# Patient Record
Sex: Female | Born: 1950 | Race: Black or African American | Hispanic: No | Smoking: Current every day smoker
Health system: Southern US, Community
[De-identification: ages and names within clinical notes are randomized; demographics above are authoritative.]

## PROBLEM LIST (undated history)

## (undated) DIAGNOSIS — I10 Essential (primary) hypertension: Secondary | ICD-10-CM

## (undated) DIAGNOSIS — E7800 Pure hypercholesterolemia, unspecified: Secondary | ICD-10-CM

## (undated) DIAGNOSIS — E876 Hypokalemia: Secondary | ICD-10-CM

## (undated) HISTORY — PX: PARTIAL HYSTERECTOMY: SHX80

---

## 2008-03-27 ENCOUNTER — Emergency Department (HOSPITAL_BASED_OUTPATIENT_CLINIC_OR_DEPARTMENT_OTHER): Admission: EM | Admit: 2008-03-27 | Discharge: 2008-03-27 | Payer: Self-pay | Admitting: Emergency Medicine

## 2011-06-05 ENCOUNTER — Emergency Department (HOSPITAL_BASED_OUTPATIENT_CLINIC_OR_DEPARTMENT_OTHER)
Admission: EM | Admit: 2011-06-05 | Discharge: 2011-06-05 | Disposition: A | Discharge status: Home / Self Care | Payer: Self-pay | Attending: Emergency Medicine | Admitting: Emergency Medicine

## 2011-06-05 ENCOUNTER — Encounter (HOSPITAL_BASED_OUTPATIENT_CLINIC_OR_DEPARTMENT_OTHER): Payer: Self-pay | Admitting: Emergency Medicine

## 2011-06-05 DIAGNOSIS — I10 Essential (primary) hypertension: Secondary | ICD-10-CM | POA: Insufficient documentation

## 2011-06-05 DIAGNOSIS — F17200 Nicotine dependence, unspecified, uncomplicated: Secondary | ICD-10-CM | POA: Insufficient documentation

## 2011-06-05 DIAGNOSIS — Z79899 Other long term (current) drug therapy: Secondary | ICD-10-CM | POA: Insufficient documentation

## 2011-06-05 DIAGNOSIS — M5380 Other specified dorsopathies, site unspecified: Secondary | ICD-10-CM | POA: Insufficient documentation

## 2011-06-05 DIAGNOSIS — M62830 Muscle spasm of back: Secondary | ICD-10-CM

## 2011-06-05 HISTORY — DX: Essential (primary) hypertension: I10

## 2011-06-05 MED ORDER — HYDROCODONE-ACETAMINOPHEN 5-325 MG PO TABS: 1.0000 | ORAL_TABLET | Freq: Four times a day (QID) | ORAL | Status: AC | PRN

## 2011-06-05 MED ORDER — IBUPROFEN 600 MG PO TABS: 600.0000 mg | ORAL_TABLET | Freq: Four times a day (QID) | ORAL | Status: AC | PRN

## 2011-06-05 MED ORDER — KETOROLAC TROMETHAMINE 60 MG/2ML IM SOLN
60.0000 mg | Freq: Once | INTRAMUSCULAR | Status: AC
  Administered 2011-06-05: 60 mg via INTRAMUSCULAR
  Filled 2011-06-05: qty 2

## 2011-06-05 MED ORDER — MORPHINE SULFATE 4 MG/ML IJ SOLN
4.0000 mg | Freq: Once | INTRAMUSCULAR | Status: AC
  Administered 2011-06-05: 4 mg via INTRAMUSCULAR
  Filled 2011-06-05: qty 1

## 2011-06-05 NOTE — ED Notes (Signed)
Back pain that started Saturday.  Denies any injury.  States has been taking muscle relaxer's and Tramadol without relief.

## 2011-06-05 NOTE — ED Provider Notes (Signed)
History     CSN: [id number]  Arrival date & time 06/05/11  1016   First MD Initiated Contact with Patient 06/05/11 1023      Chief Complaint  Patient presents with  . Back Pain    (Consider location/radiation/quality/duration/timing/severity/associated sxs/prior treatment) HPI Pt began having l sided lumbar pain 3 days ago that is characterized as spasms. No weakness, numbness, incontinence, fever, previous trauma. Pain is reproduced with movement and palpation. Has been taking ultram and muscle relaxants without symptom relief.  Past Medical History  Diagnosis Date  . Hypertension     History reviewed. No pertinent past surgical history.  No family history on file.  History  Substance Use Topics  . Smoking status: Current Some Day Smoker  . Smokeless tobacco: Not on file  . Alcohol Use: No    OB History    Grav Para Term Preterm Abortions TAB SAB Ect Mult Living                  Review of Systems  Constitutional: Negative for fever and chills.  Genitourinary: Negative for dysuria, flank pain and difficulty urinating.  Musculoskeletal: Positive for back pain. Negative for gait problem.  Skin: Negative for rash and wound.  Neurological: Negative for weakness and numbness.    Allergies  Flexeril; Lisinopril; Metoprolol; and Tramadol  Home Medications   Current Outpatient Rx  Name Route Sig Dispense Refill  . HYDROCODONE-ACETAMINOPHEN 5-325 MG PO TABS Oral Take 1 tablet by mouth every 6 (six) hours as needed for pain. 15 tablet 0  . IBUPROFEN 600 MG PO TABS Oral Take 1 tablet (600 mg total) by mouth every 6 (six) hours as needed for pain. 30 tablet 0    BP 136/96  Pulse 93  Temp(Src) 98.5 F (36.9 C) (Oral)  Resp 16  Ht 5\' 5"  (1.651 m)  Wt 150 lb (68.04 kg)  BMI 24.96 kg/m2  SpO2 100%  Physical Exam  Nursing note and vitals reviewed. Constitutional: She is oriented to person, place, and time. She appears well-developed and well-nourished. No  distress.  HENT:  Head: Normocephalic and atraumatic.  Neck: Normal range of motion. Neck supple.  Cardiovascular: Normal rate.   Pulmonary/Chest: Effort normal.  Abdominal: Soft. Bowel sounds are normal. There is no tenderness. There is no rebound and no guarding.  Musculoskeletal: Normal range of motion. She exhibits tenderness (Tenderness and muscle spasm noted in L paraspinal lumbar region). She exhibits no edema.  Neurological: She is alert and oriented to person, place, and time.       5/5 motor, ambulatory without assistance, no sensory deficits  Skin: Skin is warm and dry. No rash noted. No erythema.    ED Course  Procedures (including critical care time)  Labs Reviewed - No data to display No results found.   1. Lumbar paraspinal muscle spasm       MDM          Loren Racer, MD 06/05/11 1112

## 2011-06-05 NOTE — Discharge Instructions (Signed)

## 2013-08-09 ENCOUNTER — Emergency Department (HOSPITAL_BASED_OUTPATIENT_CLINIC_OR_DEPARTMENT_OTHER)
Admission: EM | Admit: 2013-08-09 | Discharge: 2013-08-10 | Disposition: A | Discharge status: Home / Self Care | Payer: Self-pay | Attending: Emergency Medicine | Admitting: Emergency Medicine

## 2013-08-09 ENCOUNTER — Encounter (HOSPITAL_BASED_OUTPATIENT_CLINIC_OR_DEPARTMENT_OTHER): Payer: Self-pay | Admitting: Emergency Medicine

## 2013-08-09 ENCOUNTER — Emergency Department (HOSPITAL_BASED_OUTPATIENT_CLINIC_OR_DEPARTMENT_OTHER): Payer: Self-pay

## 2013-08-09 DIAGNOSIS — R7989 Other specified abnormal findings of blood chemistry: Secondary | ICD-10-CM

## 2013-08-09 DIAGNOSIS — R748 Abnormal levels of other serum enzymes: Secondary | ICD-10-CM | POA: Insufficient documentation

## 2013-08-09 DIAGNOSIS — I10 Essential (primary) hypertension: Secondary | ICD-10-CM | POA: Insufficient documentation

## 2013-08-09 DIAGNOSIS — R55 Syncope and collapse: Secondary | ICD-10-CM | POA: Insufficient documentation

## 2013-08-09 DIAGNOSIS — E876 Hypokalemia: Secondary | ICD-10-CM | POA: Insufficient documentation

## 2013-08-09 DIAGNOSIS — F17200 Nicotine dependence, unspecified, uncomplicated: Secondary | ICD-10-CM | POA: Insufficient documentation

## 2013-08-09 DIAGNOSIS — R9431 Abnormal electrocardiogram [ECG] [EKG]: Secondary | ICD-10-CM | POA: Insufficient documentation

## 2013-08-09 LAB — CBC WITH DIFFERENTIAL/PLATELET
BASOS ABS: 0 10*3/uL (ref 0.0–0.1)
BASOS PCT: 0 % (ref 0–1)
Eosinophils Absolute: 0.1 10*3/uL (ref 0.0–0.7)
Eosinophils Relative: 2 % (ref 0–5)
HEMATOCRIT: 38.2 % (ref 36.0–46.0)
HEMOGLOBIN: 13 g/dL (ref 12.0–15.0)
LYMPHS PCT: 58 % — AB (ref 12–46)
Lymphs Abs: 2.7 10*3/uL (ref 0.7–4.0)
MCH: 31.1 pg (ref 26.0–34.0)
MCHC: 34 g/dL (ref 30.0–36.0)
MCV: 91.4 fL (ref 78.0–100.0)
MONO ABS: 0.3 10*3/uL (ref 0.1–1.0)
MONOS PCT: 7 % (ref 3–12)
NEUTROS ABS: 1.5 10*3/uL — AB (ref 1.7–7.7)
NEUTROS PCT: 33 % — AB (ref 43–77)
Platelets: 197 10*3/uL (ref 150–400)
RBC: 4.18 MIL/uL (ref 3.87–5.11)
RDW: 13.6 % (ref 11.5–15.5)
WBC: 4.7 10*3/uL (ref 4.0–10.5)

## 2013-08-09 LAB — URINALYSIS, ROUTINE W REFLEX MICROSCOPIC
Bilirubin Urine: NEGATIVE
GLUCOSE, UA: 100 mg/dL — AB
Hgb urine dipstick: NEGATIVE
Ketones, ur: NEGATIVE mg/dL
NITRITE: NEGATIVE
PH: 7.5 (ref 5.0–8.0)
Protein, ur: NEGATIVE mg/dL
SPECIFIC GRAVITY, URINE: 1.018 (ref 1.005–1.030)
Urobilinogen, UA: 1 mg/dL (ref 0.0–1.0)

## 2013-08-09 LAB — BASIC METABOLIC PANEL
ANION GAP: 11 (ref 5–15)
BUN: 26 mg/dL — ABNORMAL HIGH (ref 6–23)
CHLORIDE: 103 meq/L (ref 96–112)
CO2: 31 meq/L (ref 19–32)
CREATININE: 1.2 mg/dL — AB (ref 0.50–1.10)
Calcium: 10.1 mg/dL (ref 8.4–10.5)
GFR calc non Af Amer: 47 mL/min — ABNORMAL LOW (ref >90)
GFR, EST AFRICAN AMERICAN: 54 mL/min — AB (ref >90)
Glucose, Bld: 97 mg/dL (ref 70–99)
POTASSIUM: 2.6 meq/L — AB (ref 3.7–5.3)
Sodium: 145 mEq/L (ref 137–147)

## 2013-08-09 LAB — URINE MICROSCOPIC-ADD ON

## 2013-08-09 LAB — HEPATIC FUNCTION PANEL
ALBUMIN: 3.9 g/dL (ref 3.5–5.2)
ALT: 11 U/L (ref 0–35)
AST: 16 U/L (ref 0–37)
Alkaline Phosphatase: 63 U/L (ref 39–117)
Bilirubin, Direct: <0.2 mg/dL (ref 0.0–0.3)
TOTAL PROTEIN: 7.7 g/dL (ref 6.0–8.3)
Total Bilirubin: <0.2 mg/dL — ABNORMAL LOW (ref 0.3–1.2)

## 2013-08-09 LAB — TROPONIN I: Troponin I: <0.3 ng/mL (ref <0.30)

## 2013-08-09 LAB — PRO B NATRIURETIC PEPTIDE: PRO B NATRI PEPTIDE: 132.8 pg/mL — AB (ref 0–125)

## 2013-08-09 MED ORDER — POTASSIUM CHLORIDE CRYS ER 20 MEQ PO TBCR
40.0000 meq | EXTENDED_RELEASE_TABLET | Freq: Once | ORAL | Status: AC
  Administered 2013-08-09: 40 meq via ORAL
  Filled 2013-08-09: qty 2

## 2013-08-09 MED ORDER — ONDANSETRON HCL 4 MG/2ML IJ SOLN
4.0000 mg | Freq: Once | INTRAMUSCULAR | Status: AC
  Administered 2013-08-09: 4 mg via INTRAVENOUS
  Filled 2013-08-09: qty 2

## 2013-08-09 MED ORDER — SODIUM CHLORIDE 0.9 % IV SOLN
INTRAVENOUS | Status: DC
  Administered 2013-08-09: 21:00:00 via INTRAVENOUS

## 2013-08-09 MED ORDER — POTASSIUM CHLORIDE 10 MEQ/100ML IV SOLN
10.0000 meq | Freq: Once | INTRAVENOUS | Status: AC
  Administered 2013-08-09: 10 meq via INTRAVENOUS
  Filled 2013-08-09: qty 100

## 2013-08-09 MED ORDER — LORAZEPAM 2 MG/ML IJ SOLN
0.5000 mg | Freq: Once | INTRAMUSCULAR | Status: AC
  Administered 2013-08-09: 0.5 mg via INTRAVENOUS
  Filled 2013-08-09: qty 1

## 2013-08-09 NOTE — ED Notes (Signed)
Pt states that she has been feeling weak and faint for 3 days and she has been feeling anxious and like she may pass out (seeing stars).  Pt denies any GU symptoms but reports that she has been nauseated and had chills with this, no focal weakness

## 2013-08-09 NOTE — ED Provider Notes (Signed)
CSN: [id number]     Arrival date & time 08/09/13  1912 History   First MD Initiated Contact with Patient 08/09/13 1937     Chief Complaint  Patient presents with  . Dizziness     (Consider location/radiation/quality/duration/timing/severity/associated sxs/prior Treatment) HPI  Valerie Kane is a 63 y.o. female with past medical history significant for hypertension complaining of feeling lightheaded and presyncopal with palpitations, shortness of breath, chills and nausea over the course of 3 days. Patient denies vertigo, chest pain, cough, fever, abdominal pain,  vomiting, change in bowel or bladder habits,  change in by mouth intake, headache, unilateral weakness, dysarthria, ataxia. States that her vision has been a bit blurred. Patient states she had a stress test and possible Holter monitor in January of 2014. Does not follow with a cardiologist. States this does not feel like anxiety attack. Patient states she's been compliant with her hypertension medication : Lisinopril/hydrochlorothiazide.   Past Medical History  Diagnosis Date  . Hypertension    History reviewed. No pertinent past surgical history. No family history on file. History  Substance Use Topics  . Smoking status: Current Some Day Smoker  . Smokeless tobacco: Not on file  . Alcohol Use: No   OB History   Grav Para Term Preterm Abortions TAB SAB Ect Mult Living                 Review of Systems  10 systems reviewed and found to be negative, except as noted in the HPI.   Allergies  Flexeril; Lisinopril; Metoprolol; and Tramadol  Home Medications   Prior to Admission medications   Not on File   BP 158/105  Pulse 75  Temp(Src) 98.8 F (37.1 C) (Oral)  Resp 15  Ht 5\' 5"  (1.651 m)  Wt 110 lb (49.896 kg)  BMI 18.31 kg/m2  SpO2 97% Physical Exam  Nursing note and vitals reviewed. Constitutional: She is oriented to person, place, and time. She appears well-developed and well-nourished. No distress.   HENT:  Head: Normocephalic and atraumatic.  Mouth/Throat: Oropharynx is clear and moist.  Eyes: Conjunctivae and EOM are normal. Pupils are equal, round, and reactive to light.  Neck: Normal range of motion. Neck supple.  Cardiovascular: Normal rate, regular rhythm and intact distal pulses.   Pulmonary/Chest: Effort normal and breath sounds normal. No stridor. No respiratory distress. She has no wheezes. She has no rales. She exhibits no tenderness.  Abdominal: Soft. Bowel sounds are normal. She exhibits no distension and no mass. There is no tenderness. There is no rebound and no guarding.  Musculoskeletal: Normal range of motion. She exhibits no edema.  Neurological: She is alert and oriented to person, place, and time.  II-Visual fields grossly intact. III/IV/VI-Extraocular movements intact.  Pupils reactive bilaterally. V/VII-Smile symmetric, equal eyebrow raise,  facial sensation intact VIII- Hearing grossly intact IX/X-Normal gag XI-bilateral shoulder shrug XII-midline tongue extension Motor: 5/5 bilaterally with normal tone and bulk Cerebellar: Normal finger-to-nose  and normal heel-to-shin test.   Romberg negative Ambulates with a coordinated gait   Psychiatric: She has a normal mood and affect.    ED Course  Procedures (including critical care time) Labs Review Labs Reviewed  URINALYSIS, ROUTINE W REFLEX MICROSCOPIC - Abnormal; Notable for the following:    APPearance CLOUDY (*)    Glucose, UA 100 (*)    Leukocytes, UA SMALL (*)    All other components within normal limits  CBC WITH DIFFERENTIAL - Abnormal; Notable for the following:  Neutrophils Relative % 33 (*)    Neutro Abs 1.5 (*)    Lymphocytes Relative 58 (*)    All other components within normal limits  BASIC METABOLIC PANEL - Abnormal; Notable for the following:    Potassium 2.6 (*)    BUN 26 (*)    Creatinine, Ser 1.20 (*)    GFR calc non Af Amer 47 (*)    GFR calc Af Amer 54 (*)    All other  components within normal limits  HEPATIC FUNCTION PANEL - Abnormal; Notable for the following:    Total Bilirubin <0.2 (*)    All other components within normal limits  PRO B NATRIURETIC PEPTIDE - Abnormal; Notable for the following:    Pro B Natriuretic peptide (BNP) 132.8 (*)    All other components within normal limits  URINE MICROSCOPIC-ADD ON - Abnormal; Notable for the following:    Squamous Epithelial / LPF MANY (*)    Bacteria, UA MANY (*)    All other components within normal limits  URINE CULTURE  TROPONIN I    Imaging Review Dg Chest 2 View  08/09/2013   CLINICAL DATA:  Weakness with anxiety and syncopal sensation for 3 days.  EXAM: CHEST  2 VIEW  COMPARISON:  None.  FINDINGS: The heart size and mediastinal contours are normal. The lungs are clear. There is no pleural effusion or pneumothorax. No acute osseous findings are identified.  IMPRESSION: No active cardiopulmonary process.   Electronically Signed   By: Roxy Horseman M.D.   On: 08/09/2013 20:03     EKG Interpretation   Date/Time:  Sunday August 09 2013 19:37:35 EDT Ventricular Rate:  83 PR Interval:  158 QRS Duration: 148 QT Interval:  428 QTC Calculation: 502 R Axis:   -30 Text Interpretation:  Sinus rhythm with occasional Premature ventricular  complexes Possible Left atrial enlargement Left axis deviation Right  bundle branch block Left ventricular hypertrophy No previous ECGs  available Confirmed by [facility]  MD, Jonny Ruiz ([id number]) on 08/09/2013 9:28:25 PM     Normal sinus rhythm at 83 beats per minute with PVC, right bundle branch block, left-sided axis. Slightly prolonged QTC. No old EKG for comparison.  MDM   Final diagnoses:  Hypokalemia  Abnormal EKG  Pre-syncope  Elevated serum creatinine  Tobacco use disorder    Filed Vitals:   08/09/13 2223 08/09/13 2226 08/09/13 2230 08/09/13 2245  BP: 144/102 158/105    Pulse: 77  29 75  Temp: 98.8 F (37.1 C)     TempSrc: Oral     Resp: 12  19 15   Height:       Weight:      SpO2: 100%  97% 97%    Medications  0.9 %  sodium chloride infusion ( Intravenous New Bag/Given 08/09/13 2129)  potassium chloride 10 mEq in 100 mL IVPB (10 mEq Intravenous New Bag/Given 08/09/13 2337)  potassium chloride 10 mEq in 100 mL IVPB (0 mEq Intravenous Stopped 08/09/13 2230)  potassium chloride SA (K-DUR,KLOR-CON) CR tablet 40 mEq (40 mEq Oral Given 08/09/13 2128)  ondansetron (ZOFRAN) injection 4 mg (4 mg Intravenous Given 08/09/13 2128)  LORazepam (ATIVAN) injection 0.5 mg (0.5 mg Intravenous Given 08/09/13 2221)  potassium chloride SA (K-DUR,KLOR-CON) CR tablet 40 mEq (40 mEq Oral Given 08/09/13 2337)    Valerie Kane is a 63 y.o. female presenting with lightheaded sensation, palpitations, chills, nausea, and presyncopal sensation. EKG with PVC, right bundle branch block. Unknown if these are new features  as we have an old EKG to compare to. Patient's potassium is found to be very low at 2.6. Likely secondary to a blood pressure medication which contains hydrochlorothiazide. Patient will be repleted via IV and by mouth. Creatinine noted to be 1.2, again baseline unknown. Patient will be given gentle hydration and 75 cc per hour. Troponin negative, chest x-ray with no signs of CHF, lung sounds are clear to auscultation, there is no peripheral edema and BNP is under 150. Neuro exam is nonfocal.  Considering patient's PVCs, palpitations, low potassium and elevated renal function observation admission is warranted. Patient requests transfer to [facility]. Discussed case with internal medicine Dr. Gwenevere Abbot, who declined admission and transfer: States that potassium can be repleted and she can check with her primary care physician for recheck in the morning.  This is a shared visit with the attending physician who personally evaluated the patient and agrees with the care plan.   Urinalysis is highly contaminated. Will culture but not treat as patient has no  urinary symptoms.  Case signed out to Dr. Preston Fleeting at shift change: Plan is to recheck potassium in the a.m. and discharge to home corrects. Patient's daughter can come pick her up at that time.       Wynetta Emery, PA-C 08/10/13 [phone]

## 2013-08-09 NOTE — ED Notes (Signed)
Patient gave urine sample, I took to lab. 

## 2013-08-09 NOTE — ED Provider Notes (Signed)
Medical screening examination/treatment/procedure(s) were conducted as a shared visit with non-physician practitioner(s) and myself.  I personally evaluated the patient during the encounter.   EKG Interpretation   Date/Time:  Sunday August 09 2013 19:37:35 EDT Ventricular Rate:  83 PR Interval:  158 QRS Duration: 148 QT Interval:  428 QTC Calculation: 502 R Axis:   -30 Text Interpretation:  Sinus rhythm with occasional Premature ventricular  complexes Possible Left atrial enlargement Left axis deviation Right  bundle branch block Left ventricular hypertrophy No previous ECGs  available Confirmed by Avary Eichenberger  MD, Zahara Rembert ([id number]) on 08/09/2013 9:28:25 PM     63  year old female history of hypertension recently started combination lisinopril hydrochlorothiazide now presents with generalized weakness with palpitations with symptomatic PVCs with hypokalemia likely secondary to starting diuretic recently.  Hurman HornJohn M Kennth Vanbenschoten, MD 08/10/13 2115

## 2013-08-09 NOTE — ED Notes (Signed)
Lab called and states K level 2.6 Dr. Fonnie JarvisBednar and PA aware.

## 2013-08-10 LAB — BASIC METABOLIC PANEL
Anion gap: 9 (ref 5–15)
BUN: 18 mg/dL (ref 6–23)
CHLORIDE: 110 meq/L (ref 96–112)
CO2: 26 meq/L (ref 19–32)
Calcium: 8.3 mg/dL — ABNORMAL LOW (ref 8.4–10.5)
Creatinine, Ser: 1 mg/dL (ref 0.50–1.10)
GFR calc Af Amer: 67 mL/min — ABNORMAL LOW (ref >90)
GFR, EST NON AFRICAN AMERICAN: 58 mL/min — AB (ref >90)
GLUCOSE: 90 mg/dL (ref 70–99)
POTASSIUM: 3.6 meq/L — AB (ref 3.7–5.3)
SODIUM: 145 meq/L (ref 137–147)

## 2013-08-10 MED ORDER — POTASSIUM CHLORIDE CRYS ER 20 MEQ PO TBCR
40.0000 meq | EXTENDED_RELEASE_TABLET | Freq: Once | ORAL | Status: AC
  Administered 2013-08-10: 40 meq via ORAL
  Filled 2013-08-10: qty 2

## 2013-08-10 MED ORDER — LORAZEPAM 2 MG/ML IJ SOLN: 0.5000 mg | Freq: Once | INTRAMUSCULAR | Status: DC

## 2013-08-10 MED ORDER — POTASSIUM CHLORIDE CRYS ER 20 MEQ PO TBCR: 40.0000 meq | EXTENDED_RELEASE_TABLET | Freq: Two times a day (BID) | ORAL | Status: DC

## 2013-08-10 NOTE — ED Provider Notes (Signed)
Patient was seen and noted to be hypokalemic and with PVCs and was to be admitted to, but hospitalist at [facility] felt that she could be managed by potassium supplementation in the ED. She was given intravenous and oral potassium and potassium level rechecked and was only slightly low at 3.6. She was given additional oral potassium. During this time, she has been resting comfortably and has not had any significant ectopy. She will be discharged with a prescription for K-Dur.  Dione Boozeavid Talajah Slimp, MD 08/10/13 0630

## 2013-08-10 NOTE — Discharge Instructions (Signed)
Talk with your PCP regarding whether he should continue with potassium supplementation or whether you need to switch your blood pressure medication to one that does not cause a low potassium.  Hypokalemia Hypokalemia means that the amount of potassium in the blood is lower than normal.Potassium is a chemical, called an electrolyte, that helps regulate the amount of fluid in the body. It also stimulates muscle contraction and helps nerves function properly.Most of the body's potassium is inside of cells, and only a very small amount is in the blood. Because the amount in the blood is so small, minor changes can be life-threatening. CAUSES  Antibiotics.  Diarrhea or vomiting.  Using laxatives too much, which can cause diarrhea.  Chronic kidney disease.  Water pills (diuretics).  Eating disorders (bulimia).  Low magnesium level.  Sweating a lot. SIGNS AND SYMPTOMS  Weakness.  Constipation.  Fatigue.  Muscle cramps.  Mental confusion.  Skipped heartbeats or irregular heartbeat (palpitations).  Tingling or numbness. DIAGNOSIS  Your health care provider can diagnose hypokalemia with blood tests. In addition to checking your potassium level, your health care provider may also check other lab tests. TREATMENT Hypokalemia can be treated with potassium supplements taken by mouth or adjustments in your current medicines. If your potassium level is very low, you may need to get potassium through a vein (IV) and be monitored in the hospital. A diet high in potassium is also helpful. Foods high in potassium are:  Nuts, such as peanuts and pistachios.  Seeds, such as sunflower seeds and pumpkin seeds.  Peas, lentils, and lima beans.  Whole grain and bran cereals and breads.  Fresh fruit and vegetables, such as apricots, avocado, bananas, cantaloupe, kiwi, oranges, tomatoes, asparagus, and potatoes.  Orange and tomato juices.  Red meats.  Fruit yogurt. HOME CARE  INSTRUCTIONS  Take all medicines as prescribed by your health care provider.  Maintain a healthy diet by including nutritious food, such as fruits, vegetables, nuts, whole grains, and lean meats.  If you are taking a laxative, be sure to follow the directions on the label. SEEK MEDICAL CARE IF:  Your weakness gets worse.  You feel your heart pounding or racing.  You are vomiting or having diarrhea.  You are diabetic and having trouble keeping your blood glucose in the normal range. SEEK IMMEDIATE MEDICAL CARE IF:  You have chest pain, shortness of breath, or dizziness.  You are vomiting or having diarrhea for more than 2 days.  You faint. MAKE SURE YOU:   Understand these instructions.  Will watch your condition.  Will get help right away if you are not doing well or get worse. Document Released: 01/08/2005 Document Revised: 10/29/2012 Document Reviewed: 07/11/2012 [facility] Patient Information 2015 [place], [place]. This information is not intended to replace advice given to you by your health care provider. Make sure you discuss any questions you have with your health care provider.  Potassium Salts tablets, extended-release tablets or capsules What is this medicine? POTASSIUM (poe TASS i um) is a natural salt that is important for the heart, muscles, and nerves. It is found in many foods and is normally supplied by a well balanced diet. This medicine is used to treat low potassium. This medicine may be used for other purposes; ask your health care provider or pharmacist if you have questions. COMMON BRAND NAME(S): ED-K+10, Glu-K, K-10, K-8, K-Dur, K-Tab, Kaon-CL, Klor-Con, Klor-Con M10, Klor-Con M15, Klor-Con M20, Klotrix, Micro-K, Micro-K Extencaps, Slow-K What should I tell my health care provider  before I take this medicine? They need to know if you have any of these conditions: -dehydration -diabetes -irregular heartbeat -kidney disease -stomach ulcers or other  stomach problems -an unusual or allergic reaction to potassium salts, other medicines, foods, dyes, or preservatives -pregnant or trying to get pregnant -breast-feeding How should I use this medicine? Take this medicine by mouth with a full glass of water. Follow the directions on the prescription label. Take with food. Do not suck on, crush, or chew this medicine. If you have difficulty swallowing, ask the pharmacist how to take. Take your medicine at regular intervals. Do not take it more often than directed. Do not stop taking except on your doctor's advice. Talk to your pediatrician regarding the use of this medicine in children. Special care may be needed. Overdosage: If you think you have taken too much of this medicine contact a poison control center or emergency room at once. NOTE: This medicine is only for you. Do not share this medicine with others. What if I miss a dose? If you miss a dose, take it as soon as you can. If it is almost time for your next dose, take only that dose. Do not take double or extra doses. What may interact with this medicine? Do not take this medicine with any of the following medications: -eplerenone -sodium polystyrene sulfonate This medicine may also interact with the following medications: -medicines for blood pressure or heart disease like lisinopril, losartan, quinapril, valsartan -medicines for cold or allergies -medicines for inflammation like ibuprofen, indomethacin -medicines for Parkinson's disease -medicines for the stomach like metoclopramide, dicyclomine, glycopyrrolate -some diuretics This list may not describe all possible interactions. Give your health care provider a list of all the medicines, herbs, non-prescription drugs, or dietary supplements you use. Also tell them if you smoke, drink alcohol, or use illegal drugs. Some items may interact with your medicine. What should I watch for while using this medicine? Visit your doctor or  health care professional for regular check ups. You will need lab work done regularly. You may need to be on a special diet while taking this medicine. Ask your doctor. What side effects may I notice from receiving this medicine? Side effects that you should report to your doctor or health care professional as soon as possible: -allergic reactions like skin rash, itching or hives, swelling of the face, lips, or tongue -black, tarry stools -heartburn -irregular heartbeat -numbness or tingling in hands or feet -pain when swallowing -unusually weak or tired Side effects that usually do not require medical attention (report to your doctor or health care professional if they continue or are bothersome): -diarrhea -nausea -stomach gas -vomiting This list may not describe all possible side effects. Call your doctor for medical advice about side effects. You may report side effects to FDA at 1-800-FDA-1088. Where should I keep my medicine? Keep out of the reach of children. Store at room temperature between 15 and 30 degrees C (59 and 86 degrees F ). Keep bottle closed tightly to protect this medicine from light and moisture. Throw away any unused medicine after the expiration date. NOTE: This sheet is a summary. It may not cover all possible information. If you have questions about this medicine, talk to your doctor, pharmacist, or health care provider.  2015, [employer]. (2007-03-26 11:17:31)

## 2013-08-10 NOTE — ED Notes (Signed)
Report to Chanin, RN.  

## 2013-08-11 LAB — URINE CULTURE: Colony Count: 45000

## 2015-09-07 ENCOUNTER — Emergency Department (HOSPITAL_BASED_OUTPATIENT_CLINIC_OR_DEPARTMENT_OTHER)
Admission: EM | Admit: 2015-09-07 | Discharge: 2015-09-08 | Disposition: A | Discharge status: Home / Self Care | Payer: BLUE CROSS/BLUE SHIELD | Attending: Emergency Medicine | Admitting: Emergency Medicine

## 2015-09-07 ENCOUNTER — Encounter (HOSPITAL_BASED_OUTPATIENT_CLINIC_OR_DEPARTMENT_OTHER): Payer: Self-pay

## 2015-09-07 ENCOUNTER — Emergency Department (HOSPITAL_BASED_OUTPATIENT_CLINIC_OR_DEPARTMENT_OTHER): Payer: BLUE CROSS/BLUE SHIELD

## 2015-09-07 DIAGNOSIS — Z791 Long term (current) use of non-steroidal anti-inflammatories (NSAID): Secondary | ICD-10-CM | POA: Insufficient documentation

## 2015-09-07 DIAGNOSIS — E876 Hypokalemia: Secondary | ICD-10-CM

## 2015-09-07 DIAGNOSIS — Z79899 Other long term (current) drug therapy: Secondary | ICD-10-CM | POA: Insufficient documentation

## 2015-09-07 DIAGNOSIS — Z7982 Long term (current) use of aspirin: Secondary | ICD-10-CM | POA: Insufficient documentation

## 2015-09-07 DIAGNOSIS — R079 Chest pain, unspecified: Secondary | ICD-10-CM

## 2015-09-07 DIAGNOSIS — I10 Essential (primary) hypertension: Secondary | ICD-10-CM | POA: Insufficient documentation

## 2015-09-07 DIAGNOSIS — F17200 Nicotine dependence, unspecified, uncomplicated: Secondary | ICD-10-CM | POA: Diagnosis not present

## 2015-09-07 HISTORY — DX: Hypokalemia: E87.6

## 2015-09-07 HISTORY — DX: Pure hypercholesterolemia, unspecified: E78.00

## 2015-09-07 LAB — CBC WITH DIFFERENTIAL/PLATELET
Basophils Absolute: 0 10*3/uL (ref 0.0–0.1)
Basophils Relative: 1 %
EOS PCT: 3 %
Eosinophils Absolute: 0.1 10*3/uL (ref 0.0–0.7)
HCT: 35.4 % — ABNORMAL LOW (ref 36.0–46.0)
HEMOGLOBIN: 11.8 g/dL — AB (ref 12.0–15.0)
LYMPHS ABS: 2.2 10*3/uL (ref 0.7–4.0)
LYMPHS PCT: 46 %
MCH: 30.8 pg (ref 26.0–34.0)
MCHC: 33.3 g/dL (ref 30.0–36.0)
MCV: 92.4 fL (ref 78.0–100.0)
Monocytes Absolute: 0.4 10*3/uL (ref 0.1–1.0)
Monocytes Relative: 9 %
NEUTROS PCT: 41 %
Neutro Abs: 1.9 10*3/uL (ref 1.7–7.7)
PLATELETS: 189 10*3/uL (ref 150–400)
RBC: 3.83 MIL/uL — AB (ref 3.87–5.11)
RDW: 14.2 % (ref 11.5–15.5)
WBC: 4.7 10*3/uL (ref 4.0–10.5)

## 2015-09-07 LAB — COMPREHENSIVE METABOLIC PANEL
ALT: 16 U/L (ref 14–54)
AST: 22 U/L (ref 15–41)
Albumin: 3.7 g/dL (ref 3.5–5.0)
Alkaline Phosphatase: 57 U/L (ref 38–126)
Anion gap: 7 (ref 5–15)
BUN: 24 mg/dL — AB (ref 6–20)
CALCIUM: 9.4 mg/dL (ref 8.9–10.3)
CHLORIDE: 105 mmol/L (ref 101–111)
CO2: 29 mmol/L (ref 22–32)
Creatinine, Ser: 1.36 mg/dL — ABNORMAL HIGH (ref 0.44–1.00)
GFR calc Af Amer: 46 mL/min — ABNORMAL LOW (ref >60)
GFR, EST NON AFRICAN AMERICAN: 40 mL/min — AB (ref >60)
Glucose, Bld: 99 mg/dL (ref 65–99)
Potassium: 2.7 mmol/L — CL (ref 3.5–5.1)
SODIUM: 141 mmol/L (ref 135–145)
Total Bilirubin: 0.4 mg/dL (ref 0.3–1.2)
Total Protein: 6.9 g/dL (ref 6.5–8.1)

## 2015-09-07 LAB — TROPONIN I: Troponin I: <0.03 ng/mL (ref <0.03)

## 2015-09-07 MED ORDER — DIAZEPAM 5 MG PO TABS: 2.5000 mg | ORAL_TABLET | Freq: Two times a day (BID) | ORAL | 0 refills | Status: DC | PRN

## 2015-09-07 MED ORDER — POTASSIUM CHLORIDE CRYS ER 20 MEQ PO TBCR
40.0000 meq | EXTENDED_RELEASE_TABLET | Freq: Once | ORAL | Status: AC
  Administered 2015-09-07: 40 meq via ORAL
  Filled 2015-09-07: qty 2

## 2015-09-07 MED ORDER — POTASSIUM CHLORIDE CRYS ER 20 MEQ PO TBCR: 40.0000 meq | EXTENDED_RELEASE_TABLET | Freq: Two times a day (BID) | ORAL | 0 refills | Status: DC

## 2015-09-07 MED ORDER — ASPIRIN 81 MG PO CHEW
324.0000 mg | CHEWABLE_TABLET | Freq: Once | ORAL | Status: AC
  Administered 2015-09-07: 324 mg via ORAL
  Filled 2015-09-07: qty 4

## 2015-09-07 MED ORDER — DIAZEPAM 2 MG PO TABS
2.0000 mg | ORAL_TABLET | Freq: Once | ORAL | Status: AC
  Administered 2015-09-08: 2 mg via ORAL
  Filled 2015-09-07: qty 1

## 2015-09-07 MED ORDER — CYCLOBENZAPRINE HCL 5 MG PO TABS
5.0000 mg | ORAL_TABLET | Freq: Once | ORAL | Status: AC
  Administered 2015-09-07: 5 mg via ORAL
  Filled 2015-09-07: qty 1

## 2015-09-07 NOTE — ED Provider Notes (Signed)
[facility] Provider Note   CSN: [id number] Arrival date & time: 09/07/15  2038  By signing my name below, I, Phillis Haggis, attest that this documentation has been prepared under the direction and in the presence of Marily Memos, MD. Electronically Signed: Phillis Haggis, ED Scribe. 09/07/15. 9:18 PM.  History   Chief Complaint Chief Complaint  Patient presents with  . Chest Pain   The history is provided by the patient. No language interpreter was used.  HPI Comments: Valerie Kane is a 65 y.o. Female with a hx of HTN who presents to the Emergency Department complaining of gradually worsening, burning, intermittent left chest pain that radiates to the left arm and neck onset one day ago. Pt reports that she had one episode of chest pain yesterday and took baby aspirin to relief. She woke up this morning with the pain radiation down her arm and to her neck. Pt reports associated dizziness. She states that she is unable to lay on her left side due to pain. She had right sided arm pain a few days ago but it did not burn like her current pain. Pt has taken her medications, including lisinopril, this morning as directed. She denies diaphoresis, SOB, nausea, vomiting, or rash. She denies allergies to medications.  Past Medical History:  Diagnosis Date  . High cholesterol   . Hypertension   . Hypokalemia     There are no active problems to display for this patient.   History reviewed. No pertinent surgical history.  OB History    No data available       Home Medications    Prior to Admission medications   Medication Sig Start Date End Date Taking? Authorizing Provider  aspirin 81 MG tablet Take 81 mg by mouth daily.   Yes Historical Provider, MD  ibuprofen (ADVIL,MOTRIN) 800 MG tablet Take 800 mg by mouth every 8 (eight) hours as needed.   Yes Historical Provider, MD  LISINOPRIL-HYDROCHLOROTHIAZIDE PO Take by mouth.   Yes Historical Provider, MD  METOPROLOL  TARTRATE PO Take by mouth.   Yes Historical Provider, MD  diazepam (VALIUM) 5 MG tablet Take 0.5 tablets (2.5 mg total) by mouth every 12 (twelve) hours as needed for muscle spasms. 09/07/15   Marily Memos, MD  potassium chloride SA (K-DUR,KLOR-CON) 20 MEQ tablet Take 2 tablets (40 mEq total) by mouth 2 (two) times daily. 09/07/15 09/14/15  Marily Memos, MD    Family History No family history on file.  Social History Social History  Substance Use Topics  . Smoking status: Current Some Day Smoker  . Smokeless tobacco: Never Used  . Alcohol use No     Allergies   Flexeril [cyclobenzaprine]; Lisinopril; Metoprolol; and Tramadol   Review of Systems Review of Systems  Constitutional: Negative for diaphoresis.  Respiratory: Negative for shortness of breath.   Cardiovascular: Positive for chest pain.  Gastrointestinal: Negative for nausea and vomiting.  Skin: Negative for rash.  All other systems reviewed and are negative.    Physical Exam Updated Vital Signs BP (!) 156/117 (BP Location: Right Arm)   Pulse 74   Temp 98.8 F (37.1 C) (Oral)   Resp 20   Ht 5\' 5"  (1.651 m)   Wt 150 lb (68 kg)   SpO2 99%   BMI 24.96 kg/m   Physical Exam  Constitutional: She is oriented to person, place, and time. She appears well-developed and well-nourished.  HENT:  Head: Normocephalic and atraumatic.  Eyes: EOM are normal.  Pupils are equal, round, and reactive to light.  Neck: Normal range of motion. Neck supple.  Cardiovascular: Normal rate, regular rhythm and normal heart sounds.  Exam reveals no gallop and no friction rub.   No murmur heard. Equal radial pulses  Pulmonary/Chest: Effort normal and breath sounds normal. She has no wheezes. She exhibits tenderness.  Tenderness to left chest  Abdominal: Soft. There is no tenderness.  Musculoskeletal: Normal range of motion.  Thoracic paraspinal tenderness  Neurological: She is alert and oriented to person, place, and time.  Skin: Skin  is warm and dry.  Psychiatric: She has a normal mood and affect. Her behavior is normal.  Nursing note and vitals reviewed.    ED Treatments / Results  DIAGNOSTIC STUDIES: Oxygen Saturation is 100% on RA, normal by my interpretation.    COORDINATION OF CARE: 9:13 PM-Discussed treatment plan which includes labs, EKG, and x-ray with pt at bedside and pt agreed to plan.    Labs (all labs ordered are listed, but only abnormal results are displayed) Labs Reviewed  CBC WITH DIFFERENTIAL/PLATELET - Abnormal; Notable for the following:       Result Value   RBC 3.83 (*)    Hemoglobin 11.8 (*)    HCT 35.4 (*)    All other components within normal limits  COMPREHENSIVE METABOLIC PANEL - Abnormal; Notable for the following:    Potassium 2.7 (*)    BUN 24 (*)    Creatinine, Ser 1.36 (*)    GFR calc non Af Amer 40 (*)    GFR calc Af Amer 46 (*)    All other components within normal limits  TROPONIN I    EKG  EKG Interpretation  Date/Time:  Wednesday September 07 2015 20:50:38 EDT Ventricular Rate:  81 PR Interval:  168 QRS Duration: 142 QT Interval:  432 QTC Calculation: 501 R Axis:   -41 Text Interpretation:  Normal sinus rhythm Possible Left atrial enlargement Left axis deviation Right bundle branch block Abnormal ECG Confirmed by Morna Flud MD, Barbara CowerJASON [phone]([id number]) on 09/07/2015 9:00:41 PM       Radiology Dg Chest 2 View  Result Date: 09/07/2015 CLINICAL DATA:  Chest pain for 1 week. EXAM: CHEST  2 VIEW COMPARISON:  08/09/2013 FINDINGS: Mild cardiac enlargement without vascular congestion. No focal airspace disease or consolidation in the lungs. No blunting of costophrenic angles. No pneumothorax. Mediastinal contours appear intact. Tortuous aorta. IMPRESSION: Mild cardiac enlargement.  No evidence of active pulmonary disease. Electronically Signed   By: Burman NievesWilliam  Stevens M.D.   On: 09/07/2015 22:04    Procedures Procedures (including critical care time)  Medications Ordered in  ED Medications  cyclobenzaprine (FLEXERIL) tablet 5 mg (5 mg Oral Given 09/07/15 2139)  aspirin chewable tablet 324 mg (324 mg Oral Given 09/07/15 2139)  potassium chloride SA (K-DUR,KLOR-CON) CR tablet 40 mEq (40 mEq Oral Given 09/07/15 2254)  diazepam (VALIUM) tablet 2 mg (2 mg Oral Given 09/08/15 0021)   Initial Impression / Assessment and Plan / ED Course  I have reviewed the triage vital signs and the nursing notes.  Pertinent labs & imaging results that were available during my care of the patient were reviewed by me and considered in my medical decision making (see chart for details).  Clinical Course    65 year old female with mostly muscular pain of her paraspinal area and trapezius. Slightly radiates over the top of her shoulder to her anterior chest worse with movement and with palpation. Improved with muscle relaxers. Doubt ACS  or PE as causes. Also found hypokalemia 2.7 no EKG changes to go with that. Slightly secondary to be on diuretics and she has been noncompliant with her potassium at home so we will start it back at 40 mg twice a day and follow with her primary doctor in a week for recheck.  Final Clinical Impressions(s) / ED Diagnoses   Final diagnoses:  Hypokalemia  Nonspecific chest pain   I personally performed the services described in this documentation, which was scribed in my presence. The recorded information has been reviewed and is accurate.   New Prescriptions New Prescriptions   DIAZEPAM (VALIUM) 5 MG TABLET    Take 0.5 tablets (2.5 mg total) by mouth every 12 (twelve) hours as needed for muscle spasms.   POTASSIUM CHLORIDE SA (K-DUR,KLOR-CON) 20 MEQ TABLET    Take 2 tablets (40 mEq total) by mouth 2 (two) times daily.     Marily MemosJason Desani Sprung, MD 09/08/15 [phone]

## 2015-09-07 NOTE — ED Triage Notes (Signed)
C/o left side CP, radiates to left arm and back started last night-c/o same presentation but to right side last week-NAD-steady gait

## 2015-09-07 NOTE — ED Notes (Signed)
MD at bedside. 

## 2015-09-07 NOTE — ED Notes (Signed)
Patient transported to X-ray 

## 2015-09-07 NOTE — ED Notes (Signed)
Call from Lab critical K+  2.7 Dr Clayborne DanaMesner notified

## 2015-09-07 NOTE — ED Notes (Signed)
Patient return from X-ray 

## 2016-04-15 ENCOUNTER — Encounter (HOSPITAL_BASED_OUTPATIENT_CLINIC_OR_DEPARTMENT_OTHER): Payer: Self-pay | Admitting: Emergency Medicine

## 2016-04-15 ENCOUNTER — Emergency Department (HOSPITAL_BASED_OUTPATIENT_CLINIC_OR_DEPARTMENT_OTHER)
Admission: EM | Admit: 2016-04-15 | Discharge: 2016-04-15 | Disposition: A | Discharge status: Home / Self Care | Payer: BLUE CROSS/BLUE SHIELD | Attending: Emergency Medicine | Admitting: Emergency Medicine

## 2016-04-15 DIAGNOSIS — Z79899 Other long term (current) drug therapy: Secondary | ICD-10-CM | POA: Insufficient documentation

## 2016-04-15 DIAGNOSIS — M62838 Other muscle spasm: Secondary | ICD-10-CM

## 2016-04-15 DIAGNOSIS — M79601 Pain in right arm: Secondary | ICD-10-CM | POA: Diagnosis present

## 2016-04-15 DIAGNOSIS — F17210 Nicotine dependence, cigarettes, uncomplicated: Secondary | ICD-10-CM | POA: Insufficient documentation

## 2016-04-15 DIAGNOSIS — Z7982 Long term (current) use of aspirin: Secondary | ICD-10-CM | POA: Insufficient documentation

## 2016-04-15 DIAGNOSIS — I10 Essential (primary) hypertension: Secondary | ICD-10-CM | POA: Diagnosis not present

## 2016-04-15 MED ORDER — IBUPROFEN 800 MG PO TABS
800.0000 mg | ORAL_TABLET | Freq: Once | ORAL | Status: AC
  Administered 2016-04-15: 800 mg via ORAL
  Filled 2016-04-15: qty 1

## 2016-04-15 MED ORDER — DIAZEPAM 2 MG PO TABS
2.0000 mg | ORAL_TABLET | Freq: Once | ORAL | Status: AC
  Administered 2016-04-15: 2 mg via ORAL
  Filled 2016-04-15: qty 1

## 2016-04-15 MED ORDER — ACETAMINOPHEN 500 MG PO TABS
1000.0000 mg | ORAL_TABLET | Freq: Once | ORAL | Status: AC
  Administered 2016-04-15: 1000 mg via ORAL
  Filled 2016-04-15: qty 2

## 2016-04-15 NOTE — ED Provider Notes (Signed)
[facility]   CSN: [id number] Arrival date & time: 04/15/16  1113     History   Chief Complaint Chief Complaint  Patient presents with  . Arm Pain    HPI Valerie Kane is a 66 y.o. female.  66 yo F with a chief complaint of right-sided posterior shoulder pain. This been going on for the past couple weeks and slowly worsening. Worse with movement of the right arm and palpation. She denies any trauma. 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 This seems to make her symptoms worse.   The history is provided by the patient.  Arm Pain  This is a new problem. The current episode started more than 1 week ago. The problem occurs constantly. The problem has been gradually worsening. Pertinent negatives include no chest pain, no headaches and no shortness of breath. The symptoms are aggravated by bending and twisting. Nothing relieves the symptoms. She has tried nothing for the symptoms. The treatment provided no relief.    Past Medical History:  Diagnosis Date  . High cholesterol   . Hypertension   . Hypokalemia     There are no active problems to display for this patient.   History reviewed. No pertinent surgical history.  OB History    No data available       Home Medications    Prior to Admission medications   Medication Sig Start Date End Date Taking? Authorizing Provider  tiZANidine (ZANAFLEX) 4 MG tablet Take 4 mg by mouth 2 (two) times daily.   Yes Historical Provider, MD  aspirin 81 MG tablet Take 81 mg by mouth daily.    Historical Provider, MD  diazepam (VALIUM) 5 MG tablet Take 0.5 tablets (2.5 mg total) by mouth every 12 (twelve) hours as needed for muscle spasms. 09/07/15   Marily MemosJason Mesner, MD  ibuprofen (ADVIL,MOTRIN) 800 MG tablet Take 800 mg by mouth every 8 (eight) hours as needed.    Historical Provider, MD  LISINOPRIL-HYDROCHLOROTHIAZIDE PO  Take by mouth.    Historical Provider, MD  METOPROLOL TARTRATE PO Take by mouth.    Historical Provider, MD  potassium chloride SA (K-DUR,KLOR-CON) 20 MEQ tablet Take 2 tablets (40 mEq total) by mouth 2 (two) times daily. 09/07/15 09/14/15  Marily MemosJason Mesner, MD    Family History History reviewed. No pertinent family history.  Social History Social History  Substance Use Topics  . Smoking status: Current Some Day Smoker    Packs/day: 0.25    Types: Cigarettes  . Smokeless tobacco: Never Used  . Alcohol use No     Allergies   Flexeril [cyclobenzaprine]; Lisinopril; Metoprolol; and Tramadol   Review of Systems Review of Systems  Constitutional: Negative for chills and fever.  HENT: Negative for congestion and rhinorrhea.   Eyes: Negative for redness and visual disturbance.  Respiratory: Negative for shortness of breath and wheezing.   Cardiovascular: Negative for chest pain and palpitations.  Gastrointestinal: Negative for nausea and vomiting.  Genitourinary: Negative for dysuria and urgency.  Musculoskeletal: Positive for arthralgias and myalgias.  Skin: Negative for pallor and wound.  Neurological: Negative for dizziness and headaches.     Physical Exam Updated Vital Signs There were no vitals taken for this visit.  Physical Exam  Constitutional: She is oriented to person, place, and time. She appears well-developed and well-nourished. No distress.  HENT:  Head: Normocephalic  and atraumatic.  Eyes: EOM are normal. Pupils are equal, round, and reactive to light.  Neck: Normal range of motion. Neck supple.  Cardiovascular: Normal rate and regular rhythm.  Exam reveals no gallop and no friction rub.   No murmur heard. Pulmonary/Chest: Effort normal. She has no wheezes. She has no rales.  Abdominal: Soft. She exhibits no distension and no mass. There is no tenderness. There is no guarding.  Musculoskeletal: She exhibits tenderness (significantly tender to palpation about the  right trapezius muscle belly.). She exhibits no edema.  Pulse motor and sensation is intact distally.  Neurological: She is alert and oriented to person, place, and time.  Skin: Skin is warm and dry. She is not diaphoretic.  Psychiatric: She has a normal mood and affect. Her behavior is normal.  Nursing note and vitals reviewed.    ED Treatments / Results  Labs (all labs ordered are listed, but only abnormal results are displayed) Labs Reviewed - No data to display  EKG  EKG Interpretation None       Radiology No results found.  Procedures Procedures (including critical care time)  Medications Ordered in ED Medications  acetaminophen (TYLENOL) tablet 1,000 mg (not administered)  ibuprofen (ADVIL,MOTRIN) tablet 800 mg (not administered)  diazepam (VALIUM) tablet 2 mg (not administered)     Initial Impression / Assessment and Plan / ED Course  I have reviewed the triage vital signs and the nursing notes.  Pertinent labs & imaging results that were available during my care of the patient were reviewed by me and considered in my medical decision making (see chart for details).     66 yo F With a chief complaint of posterior right shoulder pain. Reproduced with palpation of the trapezius. Suspect muscle spasm. Will treat conservatively. PCP follow-up.  11:39 AM:  I have discussed the diagnosis/risks/treatment options with the patient and believe the pt to be eligible for discharge home to follow-up with PCP. We also discussed returning to the ED immediately if new or worsening sx occur. We discussed the sx which are most concerning (e.g., sudden worsening pain, fever, inability to tolerate by mouth ) that necessitate immediate return. Medications administered to the patient during their visit and any new prescriptions provided to the patient are listed below.  Medications given during this visit Medications  acetaminophen (TYLENOL) tablet 1,000 mg (not administered)    ibuprofen (ADVIL,MOTRIN) tablet 800 mg (not administered)  diazepam (VALIUM) tablet 2 mg (not administered)     The patient appears reasonably screen and/or stabilized for discharge and I doubt any other medical condition or other [facility] requiring further screening, evaluation, or treatment in the ED at this time prior to discharge.    Final Clinical Impressions(s) / ED Diagnoses   Final diagnoses:  Trapezius muscle spasm    New Prescriptions New Prescriptions   No medications on file     Melene Plan, DO 04/15/16 1139

## 2016-04-15 NOTE — ED Notes (Signed)
ED Provider at bedside. 

## 2016-04-15 NOTE — ED Triage Notes (Signed)
Patient reports pain to right arm x 1 month.  States she is unable to sleep on that side and has "spasms" in the right arm intermittently.  Denies injury.

## 2016-04-15 NOTE — Discharge Instructions (Signed)
Take 2 over the counter ibuprofen tablets 3 times a day or 1 over-the-counter naproxen tablets twice a day for pain. °Also take tylenol 1000mg(2 extra strength) four times a day.  ° ° °

## 2016-09-06 ENCOUNTER — Encounter (HOSPITAL_BASED_OUTPATIENT_CLINIC_OR_DEPARTMENT_OTHER): Payer: Self-pay

## 2016-09-06 ENCOUNTER — Emergency Department (HOSPITAL_BASED_OUTPATIENT_CLINIC_OR_DEPARTMENT_OTHER)
Admission: EM | Admit: 2016-09-06 | Discharge: 2016-09-06 | Disposition: A | Discharge status: Home / Self Care | Payer: BLUE CROSS/BLUE SHIELD | Attending: Emergency Medicine | Admitting: Emergency Medicine

## 2016-09-06 DIAGNOSIS — R1084 Generalized abdominal pain: Secondary | ICD-10-CM | POA: Insufficient documentation

## 2016-09-06 DIAGNOSIS — I10 Essential (primary) hypertension: Secondary | ICD-10-CM | POA: Insufficient documentation

## 2016-09-06 DIAGNOSIS — N3001 Acute cystitis with hematuria: Secondary | ICD-10-CM | POA: Diagnosis not present

## 2016-09-06 DIAGNOSIS — Z79899 Other long term (current) drug therapy: Secondary | ICD-10-CM | POA: Diagnosis not present

## 2016-09-06 DIAGNOSIS — F17210 Nicotine dependence, cigarettes, uncomplicated: Secondary | ICD-10-CM | POA: Diagnosis not present

## 2016-09-06 DIAGNOSIS — R300 Dysuria: Secondary | ICD-10-CM | POA: Diagnosis present

## 2016-09-06 LAB — URINALYSIS, ROUTINE W REFLEX MICROSCOPIC
Bilirubin Urine: NEGATIVE
Glucose, UA: NEGATIVE mg/dL
Ketones, ur: NEGATIVE mg/dL
Nitrite: POSITIVE — AB
Protein, ur: 100 mg/dL — AB
Specific Gravity, Urine: 1.013 (ref 1.005–1.030)
pH: 7 (ref 5.0–8.0)

## 2016-09-06 LAB — BASIC METABOLIC PANEL
Anion gap: 9 (ref 5–15)
BUN: 19 mg/dL (ref 6–20)
CO2: 28 mmol/L (ref 22–32)
CREATININE: 1.17 mg/dL — AB (ref 0.44–1.00)
Calcium: 9.6 mg/dL (ref 8.9–10.3)
Chloride: 104 mmol/L (ref 101–111)
GFR calc Af Amer: 55 mL/min — ABNORMAL LOW (ref >60)
GFR calc non Af Amer: 47 mL/min — ABNORMAL LOW (ref >60)
Glucose, Bld: 115 mg/dL — ABNORMAL HIGH (ref 65–99)
Potassium: 2.7 mmol/L — CL (ref 3.5–5.1)
SODIUM: 141 mmol/L (ref 135–145)

## 2016-09-06 LAB — CBC WITH DIFFERENTIAL/PLATELET
Basophils Absolute: 0 10*3/uL (ref 0.0–0.1)
Basophils Relative: 1 %
EOS ABS: 0.1 10*3/uL (ref 0.0–0.7)
Eosinophils Relative: 1 %
HCT: 38.7 % (ref 36.0–46.0)
Hemoglobin: 12.9 g/dL (ref 12.0–15.0)
LYMPHS ABS: 2.6 10*3/uL (ref 0.7–4.0)
Lymphocytes Relative: 36 %
MCH: 30.6 pg (ref 26.0–34.0)
MCHC: 33.3 g/dL (ref 30.0–36.0)
MCV: 91.9 fL (ref 78.0–100.0)
MONO ABS: 0.5 10*3/uL (ref 0.1–1.0)
Monocytes Relative: 7 %
Neutro Abs: 3.9 10*3/uL (ref 1.7–7.7)
Neutrophils Relative %: 55 %
Platelets: 209 10*3/uL (ref 150–400)
RBC: 4.21 MIL/uL (ref 3.87–5.11)
RDW: 14 % (ref 11.5–15.5)
WBC: 7.1 10*3/uL (ref 4.0–10.5)

## 2016-09-06 LAB — URINALYSIS, MICROSCOPIC (REFLEX)

## 2016-09-06 LAB — MAGNESIUM: Magnesium: 2.2 mg/dL (ref 1.7–2.4)

## 2016-09-06 MED ORDER — PHENAZOPYRIDINE HCL 200 MG PO TABS: 200.0000 mg | ORAL_TABLET | Freq: Three times a day (TID) | ORAL | 0 refills | Status: DC

## 2016-09-06 MED ORDER — LISINOPRIL-HYDROCHLOROTHIAZIDE 20-12.5 MG PO TABS: 1.0000 | ORAL_TABLET | Freq: Every day | ORAL | 0 refills | Status: DC

## 2016-09-06 MED ORDER — DEXTROSE 5 % IV SOLN
1.0000 g | Freq: Once | INTRAVENOUS | Status: AC
  Administered 2016-09-06: 1 g via INTRAVENOUS
  Filled 2016-09-06: qty 10

## 2016-09-06 MED ORDER — ONDANSETRON HCL 4 MG/2ML IJ SOLN
4.0000 mg | Freq: Once | INTRAMUSCULAR | Status: AC
  Administered 2016-09-06: 4 mg via INTRAVENOUS
  Filled 2016-09-06: qty 2

## 2016-09-06 MED ORDER — LISINOPRIL 10 MG PO TABS
20.0000 mg | ORAL_TABLET | Freq: Once | ORAL | Status: AC
  Administered 2016-09-06: 20 mg via ORAL
  Filled 2016-09-06: qty 2

## 2016-09-06 MED ORDER — POTASSIUM CHLORIDE 10 MEQ/100ML IV SOLN
10.0000 meq | Freq: Once | INTRAVENOUS | Status: DC
  Filled 2016-09-06: qty 100

## 2016-09-06 MED ORDER — POTASSIUM CHLORIDE CRYS ER 20 MEQ PO TBCR
40.0000 meq | EXTENDED_RELEASE_TABLET | Freq: Once | ORAL | Status: AC
  Administered 2016-09-06: 40 meq via ORAL
  Filled 2016-09-06: qty 2

## 2016-09-06 MED ORDER — CEPHALEXIN 500 MG PO CAPS: 500.0000 mg | ORAL_CAPSULE | Freq: Two times a day (BID) | ORAL | 0 refills | Status: DC

## 2016-09-06 MED ORDER — MORPHINE SULFATE (PF) 4 MG/ML IV SOLN
4.0000 mg | Freq: Once | INTRAVENOUS | Status: AC
  Administered 2016-09-06: 4 mg via INTRAVENOUS
  Filled 2016-09-06: qty 1

## 2016-09-06 MED ORDER — POTASSIUM CHLORIDE ER 20 MEQ PO TBCR: 20.0000 meq | EXTENDED_RELEASE_TABLET | Freq: Two times a day (BID) | ORAL | 0 refills | Status: AC

## 2016-09-06 MED ORDER — HYDROCHLOROTHIAZIDE 25 MG PO TABS
12.5000 mg | ORAL_TABLET | Freq: Once | ORAL | Status: AC
  Administered 2016-09-06: 12.5 mg via ORAL
  Filled 2016-09-06: qty 1

## 2016-09-06 MED FILL — PHENAZOPYRIDINE 200 MG TAB: 200 | 2 days supply | Qty: 6 | Fill #0

## 2016-09-06 MED FILL — CEPHALEXIN 500 MG CAPSULE: 500 | 7 days supply | Qty: 14 | Fill #0

## 2016-09-06 MED FILL — LISINOPRIL-HCTZ 20-12.5 MG: 20-12.5 | 30 days supply | Qty: 30 | Fill #0

## 2016-09-06 MED FILL — POTASSIUM CL ER 20 MEQ TABL: 20 | 15 days supply | Qty: 30 | Fill #0

## 2016-09-06 NOTE — ED Triage Notes (Signed)
Pt c/o dysuria, freq-states she feels like is "UTI"-NAD-steady gait

## 2016-09-06 NOTE — ED Notes (Signed)
Primary nurse Lupe Carneyebekah and Donovan KailK. Leaphart, PA alerted to pt K+ of 2.7

## 2016-09-06 NOTE — ED Notes (Signed)
ED Provider at bedside. 

## 2016-09-06 NOTE — ED Provider Notes (Signed)
[facility]   CSN: [id number] Arrival date & time: 09/06/16  1128     History   Chief Complaint Chief Complaint  Patient presents with  . Dysuria    HPI Valerie Kane is a 66 y.o. female.  HPI 66 year old African-American female past medical history significant for hypertension, hypokalemia presents to the emergency Department today with complaints of dysuria, urinary urgency, urinary frequency, suprapubic abdominal pain. Patient states that his symptoms started 2-3 days ago. She has been trying over-the-counter Azo at home with little relief. Patient describes the suprapubic discomfort as cramping. Nothing makes better or worse. She denies any other associated complaints of fever, chills, nausea, vomiting, vaginal bleeding, vaginal discharge, change in bowel habits, blood in stool. Patient also states that she has been off her blood pressure medicine and potassium supplement for the past month due to not being a follow-up with her primary care doctor.     Past Medical History:  Diagnosis Date  . High cholesterol   . Hypertension   . Hypokalemia     There are no active problems to display for this patient.   History reviewed. No pertinent surgical history.  OB History    No data available       Home Medications    Prior to Admission medications   Medication Sig Start Date End Date Taking? Authorizing Provider  cephALEXin (KEFLEX) 500 MG capsule Take 1 capsule (500 mg total) by mouth 2 (two) times daily. 09/06/16   Rise Mu, PA-C  lisinopril-hydrochlorothiazide (ZESTORETIC) 20-12.5 MG tablet Take 1 tablet by mouth daily. 09/06/16   Rise Mu, PA-C  phenazopyridine (PYRIDIUM) 200 MG tablet Take 1 tablet (200 mg total) by mouth 3 (three) times daily. 09/06/16   Rise Mu, PA-C  potassium chloride 20 MEQ TBCR Take 20 mEq by mouth 2 (two) times daily. 09/06/16   Rise Mu, PA-C    Family History No  family history on file.  Social History Social History  Substance Use Topics  . Smoking status: Current Every Day Smoker    Packs/day: 0.25    Types: Cigarettes  . Smokeless tobacco: Never Used  . Alcohol use No     Allergies   Flexeril [cyclobenzaprine]; Lisinopril; Metoprolol; and Tramadol   Review of Systems Review of Systems  Constitutional: Negative for chills and fever.  HENT: Negative for congestion.   Eyes: Negative for visual disturbance.  Respiratory: Negative for cough and shortness of breath.   Cardiovascular: Negative for chest pain.  Gastrointestinal: Positive for abdominal pain (suprapubic). Negative for diarrhea, nausea and vomiting.  Genitourinary: Positive for dysuria, frequency and urgency. Negative for flank pain, hematuria, vaginal bleeding and vaginal discharge.  Musculoskeletal: Negative for arthralgias and myalgias.  Skin: Negative for rash.  Neurological: Negative for dizziness, syncope, weakness, light-headedness, numbness and headaches.  Psychiatric/Behavioral: Negative for sleep disturbance. The patient is not nervous/anxious.      Physical Exam Updated Vital Signs BP (!) 148/89 (BP Location: Right Arm)   Pulse 94   Temp 99 F (37.2 C) (Oral)   Resp 18   Ht 5\' 8"  (1.727 m)   Wt 71.7 kg (158 lb)   SpO2 99%   BMI 24.02 kg/m   Physical Exam  Constitutional: She is oriented to person, place, and time. She appears well-developed and well-nourished.  Non-toxic appearance. No distress.  HENT:  Head: Normocephalic and atraumatic.  Nose: Nose normal.  Mouth/Throat: Oropharynx is clear and moist.  Eyes: Pupils  are equal, round, and reactive to light. Conjunctivae are normal. Right eye exhibits no discharge. Left eye exhibits no discharge.  Neck: Normal range of motion. Neck supple.  Cardiovascular: Normal rate, regular rhythm, normal heart sounds and intact distal pulses.   Pulmonary/Chest: Effort normal and breath sounds normal. No respiratory  distress. She exhibits no tenderness.  Abdominal: Soft. Bowel sounds are normal. There is tenderness (mild) in the suprapubic area. There is no rigidity, no rebound, no guarding, no CVA tenderness, no tenderness at McBurney's point and negative Murphy's sign.  No cva tenderness  Musculoskeletal: Normal range of motion. She exhibits no tenderness.  Lymphadenopathy:    She has no cervical adenopathy.  Neurological: She is alert and oriented to person, place, and time.  Skin: Skin is warm and dry. Capillary refill takes less than 2 seconds.  Psychiatric: Her behavior is normal. Judgment and thought content normal.  Nursing note and vitals reviewed.    ED Treatments / Results  Labs (all labs ordered are listed, but only abnormal results are displayed) Labs Reviewed  URINALYSIS, ROUTINE W REFLEX MICROSCOPIC - Abnormal; Notable for the following:       Result Value   Color, Urine ORANGE (*)    APPearance CLOUDY (*)    Hgb urine dipstick MODERATE (*)    Protein, ur 100 (*)    Nitrite POSITIVE (*)    Leukocytes, UA MODERATE (*)    All other components within normal limits  BASIC METABOLIC PANEL - Abnormal; Notable for the following:    Potassium 2.7 (*)    Glucose, Bld 115 (*)    Creatinine, Ser 1.17 (*)    GFR calc non Af Amer 47 (*)    GFR calc Af Amer 55 (*)    All other components within normal limits  URINALYSIS, MICROSCOPIC (REFLEX) - Abnormal; Notable for the following:    Bacteria, UA FEW (*)    Squamous Epithelial / LPF 0-5 (*)    All other components within normal limits  URINE CULTURE  CBC WITH DIFFERENTIAL/PLATELET  MAGNESIUM    EKG  EKG Interpretation None       Radiology No results found.  Procedures Procedures (including critical care time)  Medications Ordered in ED Medications  cefTRIAXone (ROCEPHIN) 1 g in dextrose 5 % 50 mL IVPB (0 g Intravenous Stopped 09/06/16 1431)  morphine 4 MG/ML injection 4 mg (4 mg Intravenous Given 09/06/16 1257)    ondansetron (ZOFRAN) injection 4 mg (4 mg Intravenous Given 09/06/16 1257)  potassium chloride SA (K-DUR,KLOR-CON) CR tablet 40 mEq (40 mEq Oral Given 09/06/16 1309)  lisinopril (PRINIVIL,ZESTRIL) tablet 20 mg (20 mg Oral Given 09/06/16 1424)  hydrochlorothiazide (HYDRODIURIL) tablet 12.5 mg (12.5 mg Oral Given 09/06/16 1425)     Initial Impression / Assessment and Plan / ED Course  I have reviewed the triage vital signs and the nursing notes.  Pertinent labs & imaging results that were available during my care of the patient were reviewed by me and considered in my medical decision making (see chart for details).     Patient presents to the ED with complaints of urinary frequency, urinary urgency, dysuria, suprapubic discomfort. Patient denies any associated symptoms of nausea, vomiting, fever, chills, vaginal symptoms. Vital signs are reassuring. Patient is afebrile. No tachycardia or hypotension.   Exam is reassuring. Mild discomfort with palpation of the suprapubic region. No CVA tenderness.  Urine seems consistent with a UTI given the leukocytes with wbc's and bacteria. Patient has positive nitrite which  may be due to infection versus the Azo use. No signs of pyelonephritis. No leukocytosis. Mild elevated creatinine however patient is able to tolerate oral fluids and encouraged by mouth fluids at home. Mild hypokalemia 2.7. History of same. Patient is currently taking lisinopril HCTZ. She does have potassium supplements that she has been prescribed does not take them. This was replaced orally in the ED. Patient is able to tolerate by mouth fluids without any difficulties. Overall patient is well-appearing with normal vital signs. Given dose of ceftriaxone in the ED. Will be discharged home with Keflex, potassium supplements, Pyridium. Have your patient a months supply of her blood pressure medicine encouraged her to follow up PCP.  Pt is hemodynamically stable, in NAD, & able to ambulate in  the ED. Evaluation does not show pathology that would require ongoing emergent intervention or inpatient treatment. I explained the diagnosis to the patient. Pain has been managed & has no complaints prior to dc. Pt is comfortable with above plan and is stable for discharge at this time. All questions were answered prior to disposition. Strict return precautions for f/u to the ED were discussed. Encouraged follow up with PCP.  Patient was seen and evaluated my attending who is agreeable to above plan.  Final Clinical Impressions(s) / ED Diagnoses   Final diagnoses:  Acute cystitis with hematuria    New Prescriptions Discharge Medication List as of 09/06/2016  2:14 PM    START taking these medications   Details  cephALEXin (KEFLEX) 500 MG capsule Take 1 capsule (500 mg total) by mouth 2 (two) times daily., Starting Thu 09/06/2016, Print    lisinopril-hydrochlorothiazide (ZESTORETIC) 20-12.5 MG tablet Take 1 tablet by mouth daily., Starting Thu 09/06/2016, Print    phenazopyridine (PYRIDIUM) 200 MG tablet Take 1 tablet (200 mg total) by mouth 3 (three) times daily., Starting Thu 09/06/2016, Print    potassium chloride 20 MEQ TBCR Take 20 mEq by mouth 2 (two) times daily., Starting Thu 09/06/2016, Print         Demetrios LollLeaphart, Audre Cenci T, PA-C 09/06/16 1440

## 2016-09-06 NOTE — ED Notes (Signed)
Lab notified of new orders. 

## 2016-09-06 NOTE — Discharge Instructions (Signed)
You have been diagnosed with a UTI in the ED today. Please take the Keflex which is an antibiotic twice a day for 7 days.  You may also take Pyridium which helps with bladder spasms when you urinate. Do not take this medication for more than 2 days.  Please take motrin or ibuprofen every 4-6 hours for pain along with tyleonol. Cranberry juice will help with symptoms.   Your potassium was slightly low in the ED. Please take your potassium supplements as prescribed. I have also refilled her blood pressure medicine. It is important that she follow up with her primary care doctor to continue prescribing this medication. If she develop any worsening symptoms return to the ED including vomiting, fevers, worsening pain or for any other reason.

## 2016-09-06 NOTE — ED Provider Notes (Signed)
Medical screening examination/treatment/procedure(s) were conducted as a shared visit with non-physician practitioner(s) and myself.  I personally evaluated the patient during the encounter. Briefly, the patient is a 66 y.o. female who presents to the ED with several days of dysuria and suprapubic discomfort. Denies any vaginal bleeding or discharge. No nausea, vomiting, diarrhea. Patient has been taken over-the-counter Azo. UA consistent with urinary tract infections more due to the leukocytes with wbc's and bacteria. Positive nitrites may be due to the infection or Azo use. Also noted to have hypokalemia. Replenished orally and we'll send out on a prescription for potassium. Recommended close follow-up with PCP.    EKG Interpretation None           Krystel Fletchall, Amadeo GarnetPedro Eduardo, MD 09/06/16 1311

## 2016-09-07 LAB — URINE CULTURE

## 2018-01-26 ENCOUNTER — Encounter (HOSPITAL_BASED_OUTPATIENT_CLINIC_OR_DEPARTMENT_OTHER): Payer: Self-pay | Admitting: *Deleted

## 2018-01-26 ENCOUNTER — Emergency Department (HOSPITAL_BASED_OUTPATIENT_CLINIC_OR_DEPARTMENT_OTHER)
Admission: EM | Admit: 2018-01-26 | Discharge: 2018-01-26 | Discharge status: Against Medical Advice | Payer: BLUE CROSS/BLUE SHIELD | Attending: Emergency Medicine | Admitting: Emergency Medicine

## 2018-01-26 ENCOUNTER — Emergency Department (HOSPITAL_COMMUNITY): Payer: BLUE CROSS/BLUE SHIELD

## 2018-01-26 ENCOUNTER — Emergency Department (HOSPITAL_BASED_OUTPATIENT_CLINIC_OR_DEPARTMENT_OTHER): Payer: BLUE CROSS/BLUE SHIELD

## 2018-01-26 ENCOUNTER — Other Ambulatory Visit: Payer: Self-pay

## 2018-01-26 DIAGNOSIS — R51 Headache: Secondary | ICD-10-CM | POA: Diagnosis present

## 2018-01-26 DIAGNOSIS — I10 Essential (primary) hypertension: Secondary | ICD-10-CM | POA: Diagnosis not present

## 2018-01-26 DIAGNOSIS — Z79899 Other long term (current) drug therapy: Secondary | ICD-10-CM | POA: Insufficient documentation

## 2018-01-26 DIAGNOSIS — G43009 Migraine without aura, not intractable, without status migrainosus: Secondary | ICD-10-CM | POA: Diagnosis not present

## 2018-01-26 DIAGNOSIS — F17210 Nicotine dependence, cigarettes, uncomplicated: Secondary | ICD-10-CM | POA: Diagnosis not present

## 2018-01-26 DIAGNOSIS — Z7982 Long term (current) use of aspirin: Secondary | ICD-10-CM | POA: Diagnosis not present

## 2018-01-26 LAB — URINALYSIS, ROUTINE W REFLEX MICROSCOPIC
BILIRUBIN URINE: NEGATIVE
GLUCOSE, UA: NEGATIVE mg/dL
HGB URINE DIPSTICK: NEGATIVE
KETONES UR: NEGATIVE mg/dL
Leukocytes, UA: NEGATIVE
Nitrite: NEGATIVE
PH: 6.5 (ref 5.0–8.0)
Protein, ur: NEGATIVE mg/dL
Specific Gravity, Urine: <1.005 — ABNORMAL LOW (ref 1.005–1.030)

## 2018-01-26 LAB — DIFFERENTIAL
ABS IMMATURE GRANULOCYTES: 0 10*3/uL (ref 0.00–0.07)
Basophils Absolute: 0 10*3/uL (ref 0.0–0.1)
Basophils Relative: 1 %
EOS PCT: 1 %
Eosinophils Absolute: 0.1 10*3/uL (ref 0.0–0.5)
IMMATURE GRANULOCYTES: 0 %
LYMPHS ABS: 2.2 10*3/uL (ref 0.7–4.0)
LYMPHS PCT: 50 %
Monocytes Absolute: 0.3 10*3/uL (ref 0.1–1.0)
Monocytes Relative: 8 %
Neutro Abs: 1.7 10*3/uL (ref 1.7–7.7)
Neutrophils Relative %: 40 %

## 2018-01-26 LAB — PROTIME-INR
INR: 0.94
Prothrombin Time: 12.5 seconds (ref 11.4–15.2)

## 2018-01-26 LAB — BASIC METABOLIC PANEL
Anion gap: 7 (ref 5–15)
BUN: 24 mg/dL — AB (ref 8–23)
CALCIUM: 9.9 mg/dL (ref 8.9–10.3)
CO2: 28 mmol/L (ref 22–32)
CREATININE: 1.06 mg/dL — AB (ref 0.44–1.00)
Chloride: 102 mmol/L (ref 98–111)
GFR calc Af Amer: >60 mL/min (ref >60)
GFR, EST NON AFRICAN AMERICAN: 54 mL/min — AB (ref >60)
Glucose, Bld: 83 mg/dL (ref 70–99)
POTASSIUM: 2.8 mmol/L — AB (ref 3.5–5.1)
SODIUM: 137 mmol/L (ref 135–145)

## 2018-01-26 LAB — CBC
HEMATOCRIT: 40.7 % (ref 36.0–46.0)
HEMOGLOBIN: 13.3 g/dL (ref 12.0–15.0)
MCH: 31.1 pg (ref 26.0–34.0)
MCHC: 32.7 g/dL (ref 30.0–36.0)
MCV: 95.3 fL (ref 80.0–100.0)
Platelets: 211 10*3/uL (ref 150–400)
RBC: 4.27 MIL/uL (ref 3.87–5.11)
RDW: 13.8 % (ref 11.5–15.5)
WBC: 4.3 10*3/uL (ref 4.0–10.5)
nRBC: 0 % (ref 0.0–0.2)

## 2018-01-26 LAB — RAPID URINE DRUG SCREEN, HOSP PERFORMED
AMPHETAMINES: NOT DETECTED
BENZODIAZEPINES: NOT DETECTED
Barbiturates: NOT DETECTED
COCAINE: NOT DETECTED
OPIATES: NOT DETECTED
TETRAHYDROCANNABINOL: NOT DETECTED

## 2018-01-26 LAB — TROPONIN I: Troponin I: <0.03 ng/mL (ref <0.03)

## 2018-01-26 LAB — APTT: APTT: 32 s (ref 24–36)

## 2018-01-26 LAB — SEDIMENTATION RATE: Sed Rate: 12 mm/hr (ref 0–22)

## 2018-01-26 LAB — ETHANOL

## 2018-01-26 MED ORDER — KETOROLAC TROMETHAMINE 30 MG/ML IJ SOLN
30.0000 mg | Freq: Once | INTRAMUSCULAR | Status: AC
  Administered 2018-01-26: 30 mg via INTRAVENOUS
  Filled 2018-01-26: qty 1

## 2018-01-26 MED ORDER — METOCLOPRAMIDE HCL 5 MG/ML IJ SOLN
10.0000 mg | Freq: Once | INTRAMUSCULAR | Status: AC
  Administered 2018-01-26: 10 mg via INTRAVENOUS
  Filled 2018-01-26: qty 2

## 2018-01-26 MED ORDER — LORAZEPAM 2 MG/ML IJ SOLN
0.5000 mg | Freq: Once | INTRAMUSCULAR | Status: AC
  Administered 2018-01-26: 0.5 mg via INTRAVENOUS
  Filled 2018-01-26: qty 1

## 2018-01-26 MED ORDER — POTASSIUM CHLORIDE CRYS ER 20 MEQ PO TBCR
40.0000 meq | EXTENDED_RELEASE_TABLET | Freq: Once | ORAL | Status: AC
  Administered 2018-01-26: 40 meq via ORAL
  Filled 2018-01-26: qty 2

## 2018-01-26 MED ORDER — SODIUM CHLORIDE 0.9 % IV BOLUS
1000.0000 mL | Freq: Once | INTRAVENOUS | Status: AC
  Administered 2018-01-26: 1000 mL via INTRAVENOUS

## 2018-01-26 MED ORDER — GADOBUTROL 1 MMOL/ML IV SOLN
6.0000 mL | Freq: Once | INTRAVENOUS | Status: AC | PRN
  Administered 2018-01-26: 6 mL via INTRAVENOUS

## 2018-01-26 MED ORDER — SODIUM CHLORIDE 0.9 % IV SOLN
INTRAVENOUS | Status: DC
  Administered 2018-01-26: 18:00:00 via INTRAVENOUS

## 2018-01-26 MED ORDER — DEXAMETHASONE SODIUM PHOSPHATE 10 MG/ML IJ SOLN
10.0000 mg | Freq: Once | INTRAMUSCULAR | Status: AC
  Administered 2018-01-26: 10 mg via INTRAVENOUS
  Filled 2018-01-26: qty 1

## 2018-01-26 NOTE — Consult Note (Signed)
   [facility] Consult Services  Impression:  Probable complicated migraine.  However, age, history of time since last migraine (many years), and presence of vascular risk factors, would make me want to exclude any ischemia or other intracranial pathology with more certainty before attributing symptoms to migraine.  Absence of fevers, meningismus makes meningeal infection an unrealistic consideration.  Recommendations:  =>  ESR, CRP. =>  Brain MRI w/wo. =>  Symptomatic treatment of headache, nausea. =>  BP control.  Verdis Prime, MD [facility]  ---------------------------------------------------------------------  History: 68 yo woman with h/o HTN, migraine.  Patient reports that she was at work yesterday morning when she started to feel faint and lightheaded, like she was going to pass out.  She also felt slightly nauseated and didn't feel well.  Then she started to develop a right-sided headache.  The headache was described as located retroorbital/frontal and was throbbing/pounding in quality, was associated with blurry vision, was associated with nausea, was also associated with some tingling around the right side of her lip and chin on her face.  She went home and tried to lie down to sleep.  This morning the headache persisted and she still felt nauseous and blurry vision.  She does have a history of migraine, although used to have these when she was younger, reports that she has not had what she would consider to be a migraine for many years.  Diagnostic Testing: Labs:  Reviewed.   Imaging/Testing: CT head negative for acute pathology, some chronic microangiopathic changes.  Vital Signs:   Afeb, P 81, BP 181/117.  Exam:  Mental Status:  Alert, oriented. Fluent speech, intact naming. No dysarthria.   Cranial Nerves:  Pupils: Equal round and reactive to light Extraocular movements: Intact in all cardinal gaze No visual field cut Facial sensation:  Subjective slight diminution to touch R face compared to L. Facial movements: Intact and symmetric   Tongue midline No dysarthria  Motor Exam:  No drift x 4.  Fine finger movements normal and equal.  Tremor/Abnormal Movements:  No tremors, myoclonus.  Sensory Exam:   Intact to touch in all extremities.  Coordination:   No ataxia to finger/nose or heel/shin.  Medical Decision Making:  - Extensive number of diagnosis or management options are considered above.   - Extensive amount of complex data reviewed.   - High risk of complication and/or morbidity or mortality are associated with differential diagnostic considerations above.  - There may be uncertain outcome and increased probability of prolonged functional impairment or high probability of severe prolonged functional impairment associated with some of these differential diagnosis.   Medical Data Reviewed:  1.Data reviewed include clinical labs, radiology,  Medical Tests;   2.Tests results discussed w/performing or interpreting physician;   3.Obtaining/reviewing old medical records;  4.Obtaining case history from another source;  5.Independent review of image, tracing or specimen.   Patient was informed the Neurology Consult would happen via telehealth (remote video) and consented to receiving care in this manner.

## 2018-01-26 NOTE — ED Provider Notes (Signed)
Received patient from [facility] who complained of migrainous type headache with some associated right-sided facial numbness.  Patient was seen by Dr. Jacqulyn Bath who  consulted tele-neurology.  Was medicated for pain there with Toradol continues to still note discomfort.  Tele-neurology recommends patient have an MRI and if negative patient can be discharged home.  She denies any advancement of her paresthesias.  No new weakness.  Has not had any emesis.   8:13 PM Patient treated for her headache and feels better at this time.  Patient eloped prior to the results of her MRI which did not show any acute stroke.   Lorre Nick, MD 01/26/18 2014

## 2018-01-26 NOTE — ED Provider Notes (Signed)
[facility] EMERGENCY DEPARTMENT Provider Note   CSN: [id number] Arrival date & time: 01/26/18  [id number]     History   Chief Complaint Chief Complaint  Patient presents with  . Headache  . Chest Pain    HPI Valerie SakaiWillie Ann Kane is a 68 y.o. female.  Patient is a 68 year old female with a history of hypertension and hyperlipidemia who presents with elevated blood pressure and headache.  She states that yesterday afternoon she started having a headache.  It has been both bifrontal and in the back of her head although now it only really hurts in the bifrontal area.  She is felt dizzy and lightheaded.  She denies any feeling of being off balance.  She states that this morning she noticed some numbness in her right cheek.  She denies any slurred speech.  No difficulty getting her words out.  She does report some blurry vision in her right eye.  She also has had some intermittent chest pains since last night.  She denies any currently.  No shortness of breath.  No numbness or weakness to her extremities.  She states she had similar symptoms several years ago when she was in [place] and was told she had a small stroke.     Past Medical History:  Diagnosis Date  . High cholesterol   . Hypertension   . Hypokalemia     There are no active problems to display for this patient.   History reviewed. No pertinent surgical history.   OB History   No obstetric history on file.      Home Medications    Prior to Admission medications   Medication Sig Start Date End Date Taking? Authorizing Provider  hydrochlorothiazide (HYDRODIURIL) 25 MG tablet Take 25 mg by mouth daily.   Yes [provider]  lisinopril (PRINIVIL,ZESTRIL) 40 MG tablet Take 40 mg by mouth daily.   Yes [provider]  aspirin EC 81 MG tablet Take by mouth.    [provider]  potassium chloride 20 MEQ TBCR Take 20 mEq by mouth 2 (two) times daily. 09/06/16   Rise MuLeaphart, Kenneth T, PA-C     Family History No family history on file.  Social History Social History   Tobacco Use  . Smoking status: Current Every Day Smoker    Packs/day: 0.25    Types: Cigarettes  . Smokeless tobacco: Never Used  Substance Use Topics  . Alcohol use: No  . Drug use: No     Allergies   Patient has no active allergies.   Review of Systems Review of Systems  Constitutional: Negative for chills, diaphoresis, fatigue and fever.  HENT: Negative for congestion, rhinorrhea and sneezing.   Eyes: Negative.   Respiratory: Negative for cough, chest tightness and shortness of breath.   Cardiovascular: Positive for chest pain. Negative for leg swelling.  Gastrointestinal: Negative for abdominal pain, blood in stool, diarrhea, nausea and vomiting.  Genitourinary: Negative for difficulty urinating, flank pain, frequency and hematuria.  Musculoskeletal: Negative for arthralgias and back pain.  Skin: Negative for rash.  Neurological: Positive for dizziness, numbness and headaches. Negative for speech difficulty and weakness.     Physical Exam Updated Vital Signs BP (!) 181/117 (BP Location: Left Arm)   Pulse 81   Temp 98.6 F (37 C) (Oral)   Resp 20   Ht 5\' 5"  (1.651 m)   Wt 64 kg   SpO2 99%   BMI 23.46 kg/m   Physical Exam Constitutional:  Appearance: She is well-developed.  HENT:     Head: Normocephalic and atraumatic.  Eyes:     Pupils: Pupils are equal, round, and reactive to light.  Neck:     Musculoskeletal: Normal range of motion and neck supple.  Cardiovascular:     Rate and Rhythm: Normal rate and regular rhythm.     Heart sounds: Normal heart sounds.  Pulmonary:     Effort: Pulmonary effort is normal. No respiratory distress.     Breath sounds: Normal breath sounds. No wheezing or rales.  Chest:     Chest wall: No tenderness.  Abdominal:     General: Bowel sounds are normal.     Palpations: Abdomen is soft.     Tenderness: There is no abdominal  tenderness. There is no guarding or rebound.  Musculoskeletal: Normal range of motion.  Lymphadenopathy:     Cervical: No cervical adenopathy.  Skin:    General: Skin is warm and dry.     Findings: No rash.  Neurological:     Mental Status: She is alert and oriented to person, place, and time.     Comments: Motor 5/5 all extremities Sensation grossly intact to LT all extremities Finger to Nose intact, no pronator drift CN II-XII grossly intact other than some numbness to LT along right cheek No visual field deficits Gait normal       ED Treatments / Results  Labs (all labs ordered are listed, but only abnormal results are displayed) Labs Reviewed  BASIC METABOLIC PANEL - Abnormal; Notable for the following components:      Result Value   Potassium 2.8 (*)    BUN 24 (*)    Creatinine, Ser 1.06 (*)    GFR calc non Af Amer 54 (*)    All other components within normal limits  ETHANOL  PROTIME-INR  APTT  CBC  DIFFERENTIAL  TROPONIN I  RAPID URINE DRUG SCREEN, HOSP PERFORMED  URINALYSIS, ROUTINE W REFLEX MICROSCOPIC    EKG EKG Interpretation  Date/Time:  Sunday January 26 2018 13:39:50 EST Ventricular Rate:  59 PR Interval:    QRS Duration: 153 QT Interval:  430 QTC Calculation: 426 R Axis:   -28 Text Interpretation:  Sinus arrhythmia Probable left atrial enlargement Right bundle branch block LVH with secondary repolarization abnormality similiar to EKG from 08/09/2013 Confirmed by Rolan BuccoBelfi, Brendyn Mclaren [phone]([id number]) on 01/26/2018 1:48:13 PM   Radiology Ct Head Wo Contrast  Result Date: 01/26/2018 CLINICAL DATA:  Headache beginning yesterday, persistent. Hypertension. EXAM: CT HEAD WITHOUT CONTRAST TECHNIQUE: Contiguous axial images were obtained from the base of the skull through the vertex without intravenous contrast. COMPARISON:  None. FINDINGS: Brain: The brainstem and cerebellum are normal. Cerebral hemispheres show chronic small-vessel ischemic changes of the white matter. No  sign of acute infarction, mass lesion, hemorrhage, hydrocephalus or extra-axial collection. Vascular: There is atherosclerotic calcification of the major vessels at the base of the brain. Skull: Normal Sinuses/Orbits: Clear/normal Other: None IMPRESSION: No acute finding. Chronic small-vessel ischemic changes of the cerebral hemispheric white matter. Electronically Signed   By: Paulina FusiMark  Shogry M.D.   On: 01/26/2018 14:12    Procedures Procedures (including critical care time)  Medications Ordered in ED Medications  potassium chloride SA (K-DUR,KLOR-CON) CR tablet 40 mEq (40 mEq Oral Given 01/26/18 1526)     Initial Impression / Assessment and Plan / ED Course  I have reviewed the triage vital signs and the nursing notes.  Pertinent labs & imaging results that were available  during my care of the patient were reviewed by me and considered in my medical decision making (see chart for details).     Patient is a 68 year old female who presents with elevated blood pressure, headache and left side facial numbness with blurry vision.  She has evidence of hypokalemia and was given some potassium replacement.  She does have potassium tablets at home to use as well.  She is currently awaiting a telemetry neurology consult.  Dr. Jacqulyn Bath to take over care pending this consult.  Final Clinical Impressions(s) / ED Diagnoses   Final diagnoses:  None    ED Discharge Orders    None       Rolan Bucco, MD 01/26/18 [phone]

## 2018-01-26 NOTE — ED Provider Notes (Signed)
Blood pressure (!) 181/117, pulse 81, temperature 98.6 F (37 C), temperature source Oral, resp. rate 20, height '5\' 5"'  (1.651 m), weight 64 kg, SpO2 99 %.  Assuming care from Dr. Tamera Punt.  In short, Valerie Kane is a 68 y.o. female with a chief complaint of Headache and Chest Pain .  Refer to the original H&P for additional details.  The current plan of care is to f/u with Teleneuro evaluation.  Reviewed the consult note from tele-neurology after their evaluation.  Patient for complex migraine is elevated.  Lower suspicion for CVA but they are requesting MRI for evaluation of possible acute ischemic process.  I have given the patient Toradol here.  I will allow her to be permissively hypertensive.  I spoke with Dr. Zenia Resides at [facility] who accepts the patient to the ED for MR Brain. Added ESR per Neurology request.     Margette Fast, MD 01/27/18 [phone]

## 2018-01-26 NOTE — ED Notes (Signed)
Call placed to Tele[facility]uro regarding consult. Will call back when MD is ready for consult.

## 2018-01-26 NOTE — ED Notes (Signed)
ED Provider at bedside. 

## 2018-01-26 NOTE — ED Triage Notes (Signed)
Pt reports headache yesterday and today. States bp was elevated yesterday 168/102. C/o feeling cold

## 2018-01-26 NOTE — ED Triage Notes (Signed)
At the end of triage pt states she had "gas pains" yesterday and today, points under left breast for location of pain

## 2018-01-26 NOTE — ED Notes (Signed)
Patient transported to CT 

## 2018-01-26 NOTE — ED Notes (Signed)
Family aware of where to take pt on arrival to [facility]  Pt A/O. Ambulatory. Spoke with Tobi Bastos, Consulting civil engineer at [facility] to give report.  IV secure for transport.

## 2018-01-27 ENCOUNTER — Telehealth (HOSPITAL_COMMUNITY): Payer: Self-pay

## 2018-01-27 LAB — C-REACTIVE PROTEIN: CRP: <0.8 mg/dL (ref <1.0)

## 2020-01-05 IMAGING — MR MR HEAD WO/W CM
11 of 13 series · 38 of 48 positions shown · IV contrast (gadavist)
Comparison: 01/26/2017 CT head

CLINICAL DATA: 67 y/o F; Headache, chronic, neuro deficit Focal
neuro deficit, > 6 hrs, stroke suspected.

EXAM:
MRI HEAD WITHOUT AND WITH CONTRAST
TECHNIQUE: Multiplanar, multiecho pulse sequences of the brain and surrounding
structures were obtained without and with intravenous contrast.
CONTRAST:  6 cc Gadavist

[Series 5: DWI · axial · 3.0mm · 0.88mm/px · z∈[-62,+88]mm · 7 of 104 slices shown (1 of 4)]
[im 1/104]
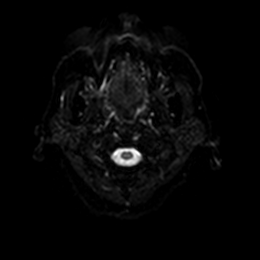
[im 18/104]
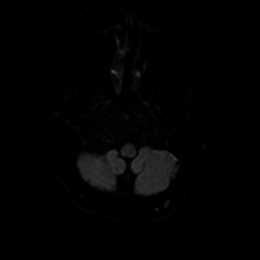
[im 35/104]
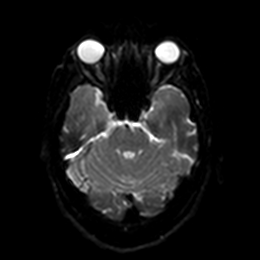
[im 52/104]
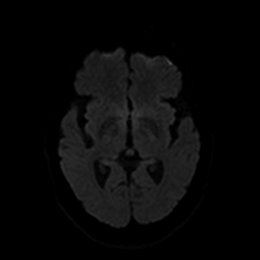
[im 69/104]
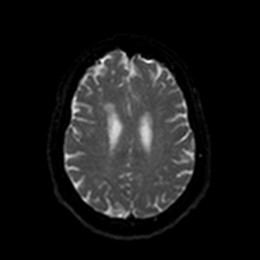
[im 86/104]
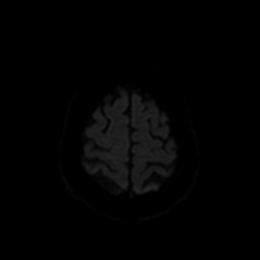
[im 104/104]
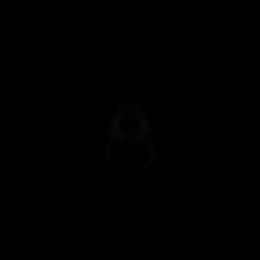

[Series 6: DWI · axial · 3.0mm · 0.88mm/px · z∈[-62,+88]mm · 3 of 52 slices shown (2 of 4)]
[im 1/52]
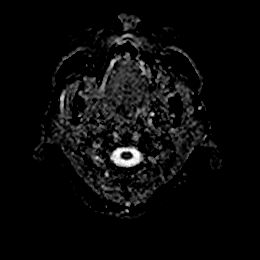
[im 26/52]
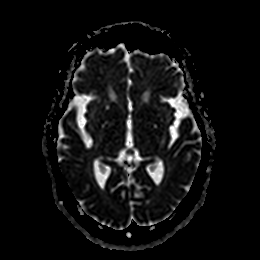
[im 52/52]
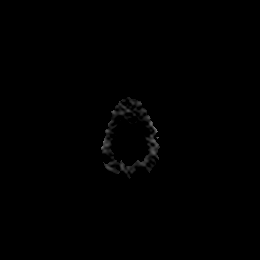

[Series 7: DWI · coronal · 4.0mm · 0.88mm/px · 6 of 72 slices shown (3 of 4)]
[im 1/72]
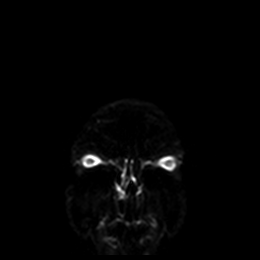
[im 15/72]
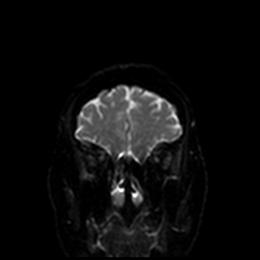
[im 29/72]
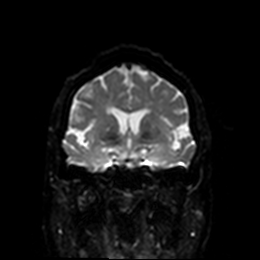
[im 43/72]
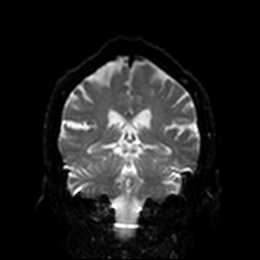
[im 57/72]
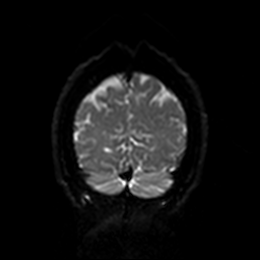
[im 72/72]
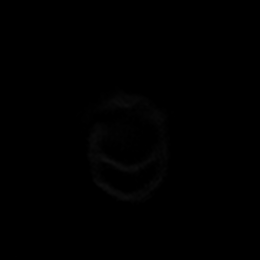

[Series 8: DWI · coronal · 4.0mm · 0.88mm/px · 3 of 36 slices shown (4 of 4)]
[im 1/36]
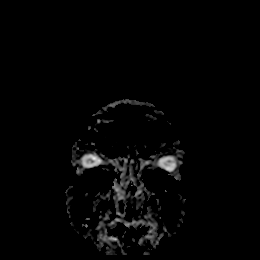
[im 18/36]
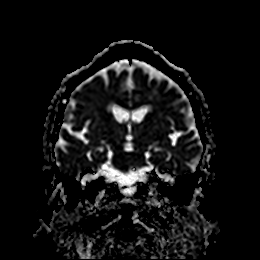
[im 36/36]
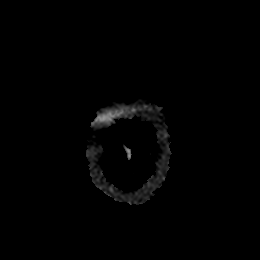

[Series 9: T1 · sagittal · 5.0mm · 0.75mm/px · 2 of 20 slices shown]
[im 1/20]
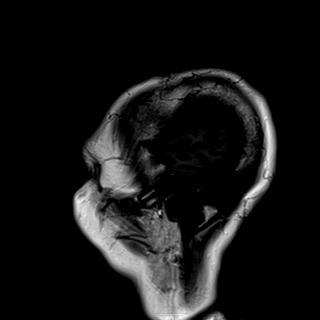
[im 20/20]
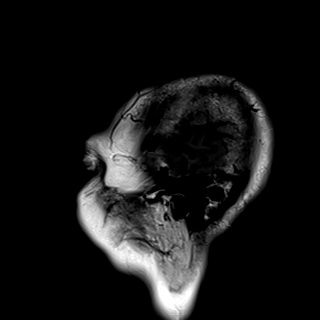

[Series 10: T2 · axial · 5.0mm · 0.72mm/px · z∈[-62,+79]mm · 2 of 25 slices shown]
[im 1/25]
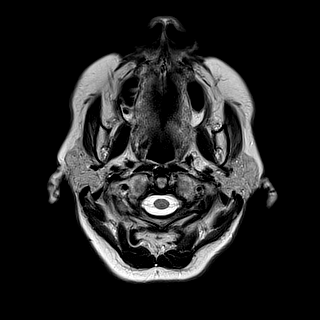
[im 25/25]
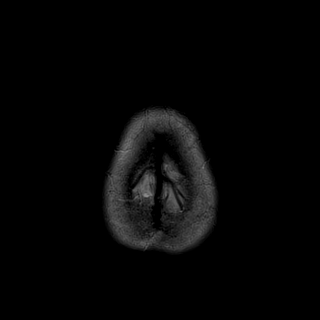

[Series 11: FLAIR · axial · 5.0mm · 0.45mm/px · z∈[-61,+80]mm · 2 of 25 slices shown]
[im 1/25]
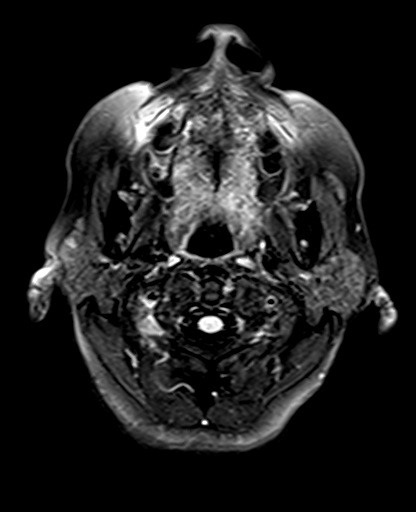
[im 25/25]
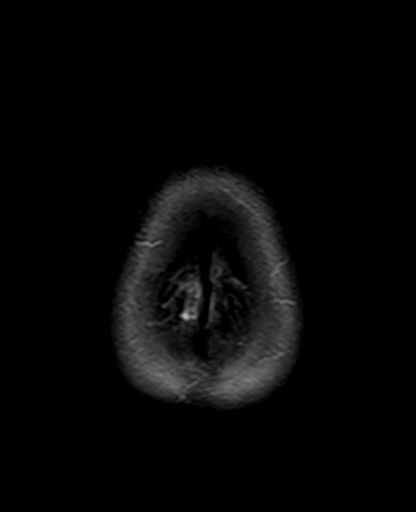

[Series 12: swi_images · axial · 3.0mm · 0.90mm/px · z∈[-74,+100]mm · 5 of 60 slices shown]
[im 1/60]
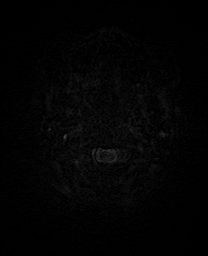
[im 15/60]
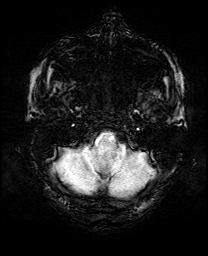
[im 30/60]
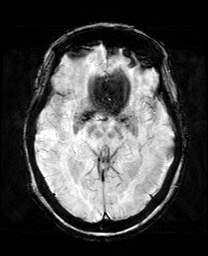
[im 45/60]
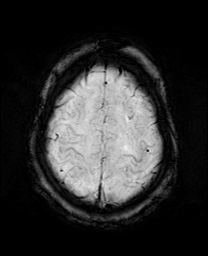
[im 60/60]
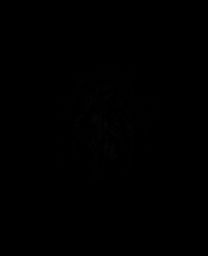

[Series 13: mip_images(sw) · axial · 24.0mm · 0.90mm/px · z∈[-63,+89]mm · 4 of 53 slices shown]
[im 1/53]
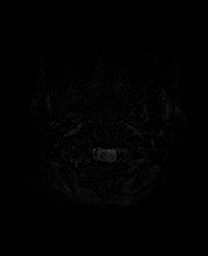
[im 18/53]
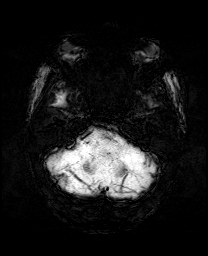
[im 35/53]
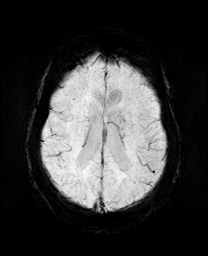
[im 53/53]
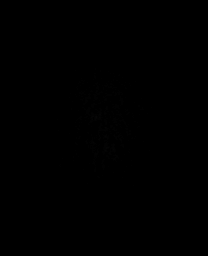

[Series 15: T2 post-contrast · coronal · 5.0mm · 0.72mm/px · 2 of 28 slices shown]
[im 1/28]
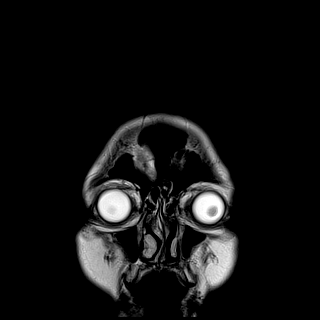
[im 28/28]
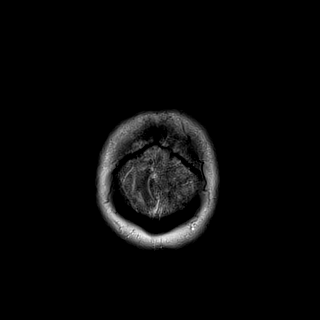

[Series 17: T1 post-contrast · coronal · 5.0mm · 0.34mm/px · 2 of 28 slices shown]
[im 1/28]
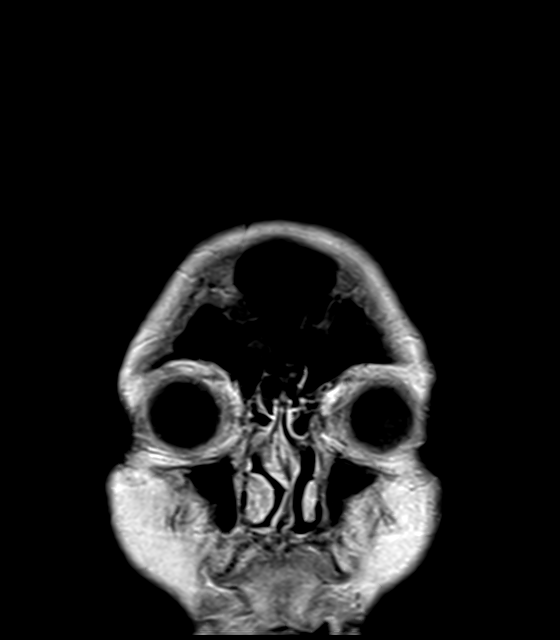
[im 28/28]
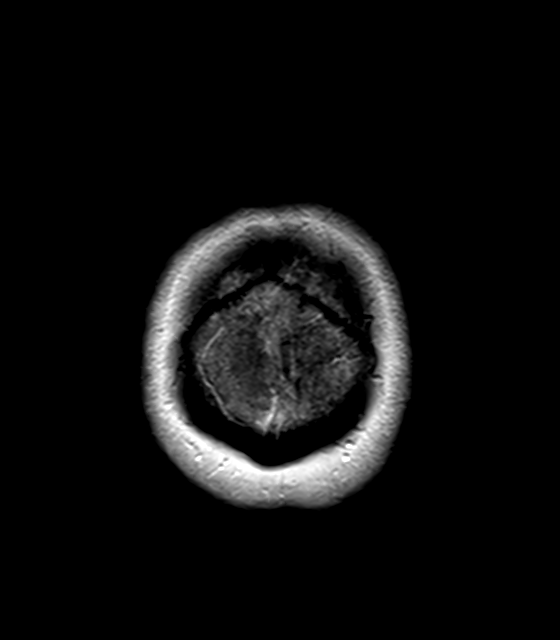

[38 of 48 positions shown; findings below may reference images not displayed]

FINDINGS: Brain: No acute infarction, hemorrhage, hydrocephalus, extra-axial
collection or mass lesion. Very small chronic infarctions within the
right inferior cerebellar hemisphere and right caudate head. Few
nonspecific T2 FLAIR hyperintensities in subcortical and
periventricular white matter are compatible with mild chronic
microvascular ischemic changes for age. Mild volume loss of the
brain. After administration of intravenous contrast there is no
abnormal enhancement of the brain.

Vascular: Normal flow voids.

Skull and upper cervical spine: Normal marrow signal.

Sinuses/Orbits: Negative.

Other: None.
IMPRESSION: 1. No acute intracranial process or abnormal enhancement of the
brain.
2. Mild chronic microvascular ischemic changes and volume loss of
the brain. Few very small chronic infarcts in right caudate head and
right cerebellar hemisphere.

## 2020-01-05 IMAGING — CT CT HEAD W/O CM
3 series · 14 of 47 positions shown, 16 images · non-contrast
Comparison: None.

CLINICAL DATA: Headache beginning yesterday, persistent.
Hypertension.

EXAM:
CT HEAD WITHOUT CONTRAST
TECHNIQUE: Contiguous axial images were obtained from the base of the skull
through the vertex without intravenous contrast.

[Series 2: head wo · axial · 0.47mm/px · z∈[-131,-1]mm · 8 of 32 slices shown, 10 images]
[im 3/32  brain]
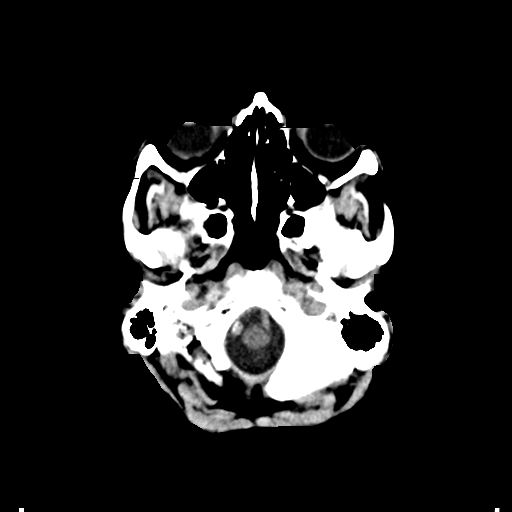
[im 3/32  bone]
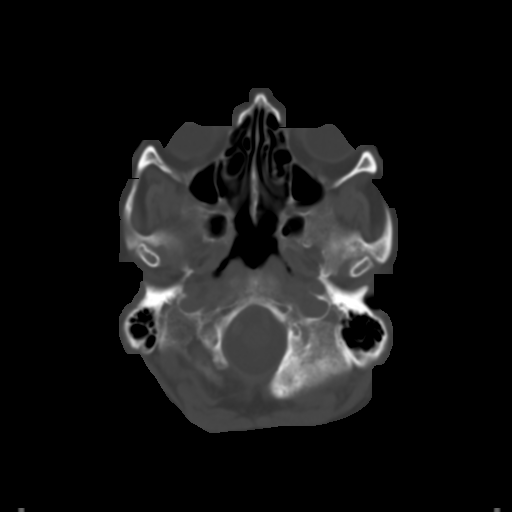
[im 7/32  brain]
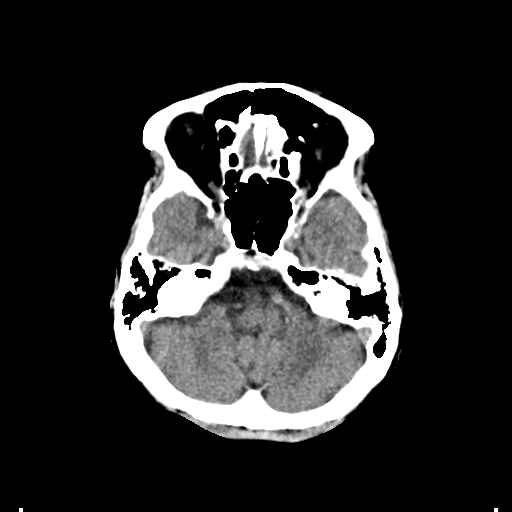
[im 10/32  brain]
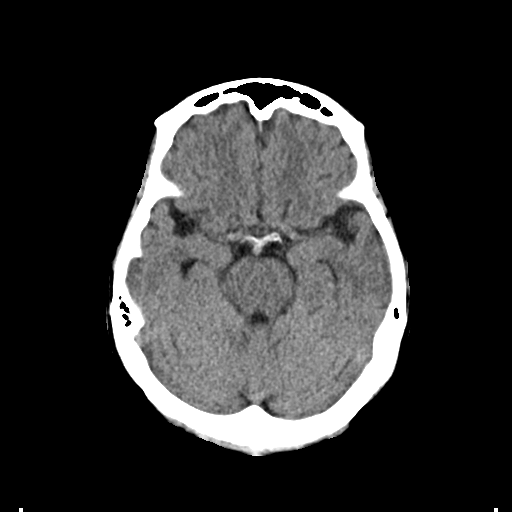
[im 14/32  brain]
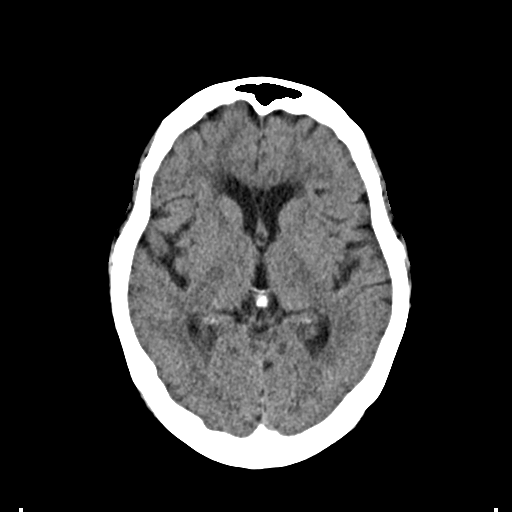
[im 18/32  brain]
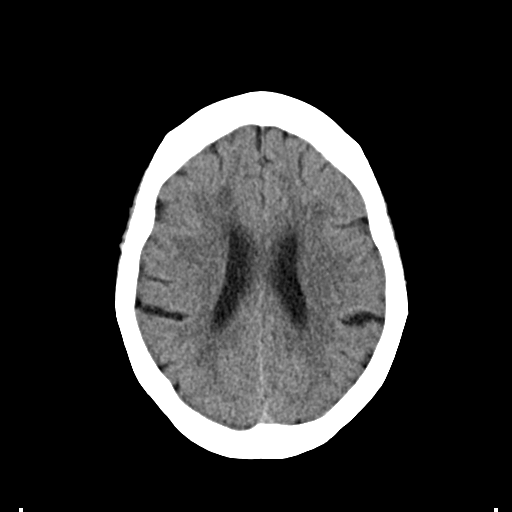
[im 18/32  bone]
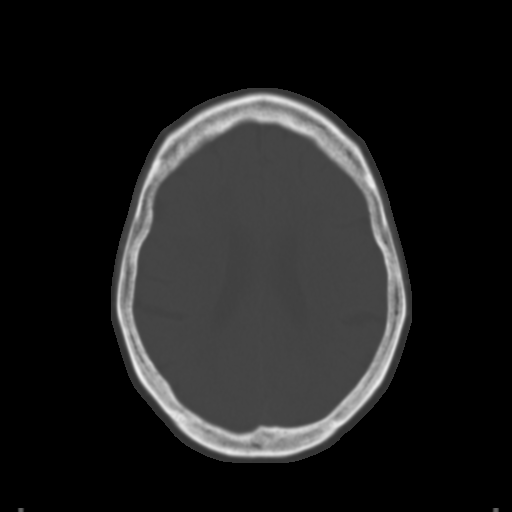
[im 22/32  brain]
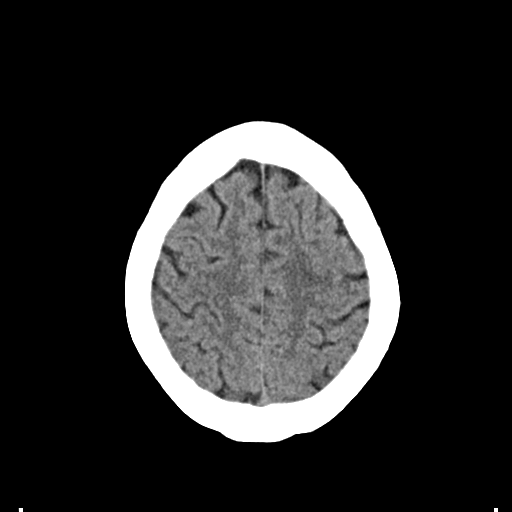
[im 25/32  brain]
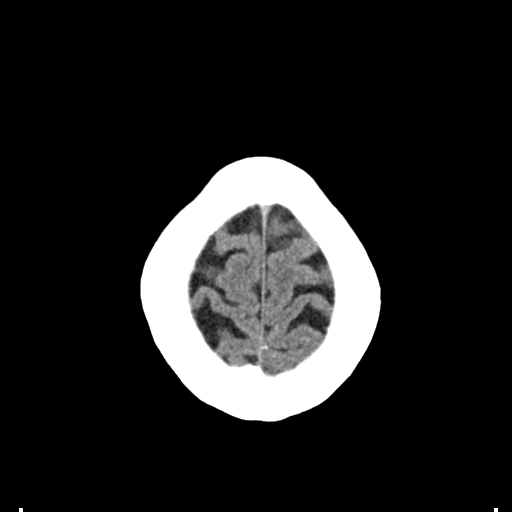
[im 29/32  brain]
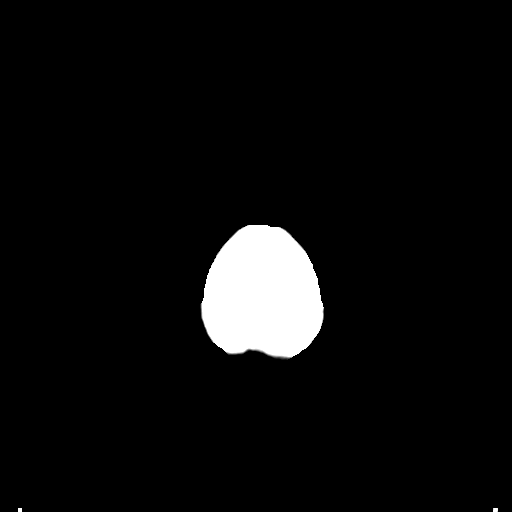

[Series 4: coronal soft · coronal · 0.31mm/px · 3 of 62 slices shown]
[im 21/62  brain]
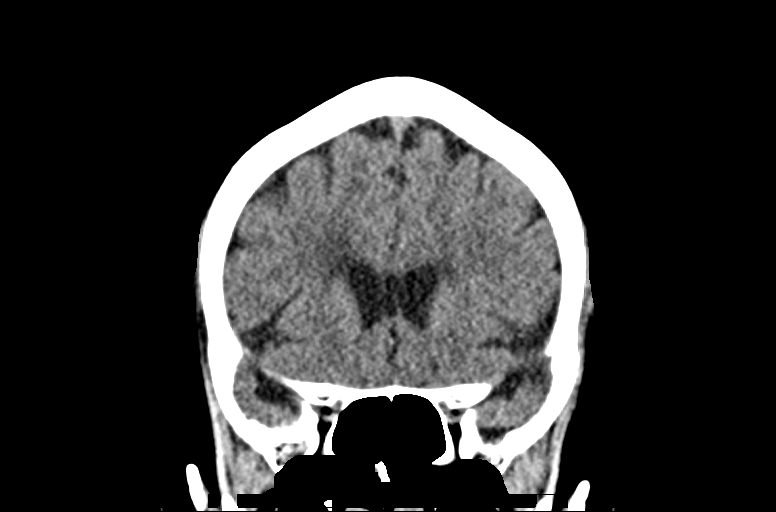
[im 28/62  brain]
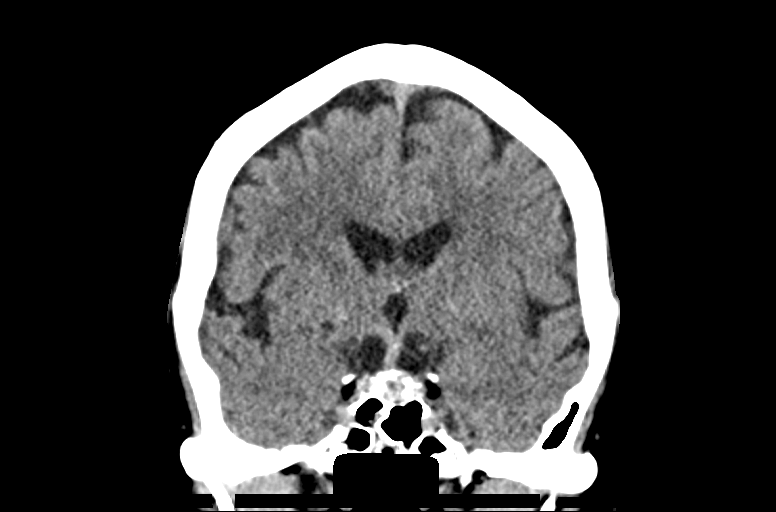
[im 34/62  brain]
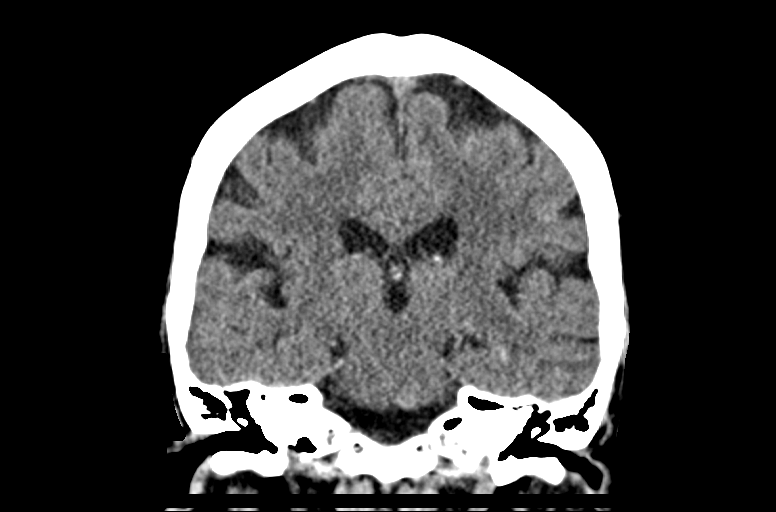

[Series 5: sag soft · sagittal · 0.31mm/px · 3 of 49 slices shown]
[im 17/49  brain]
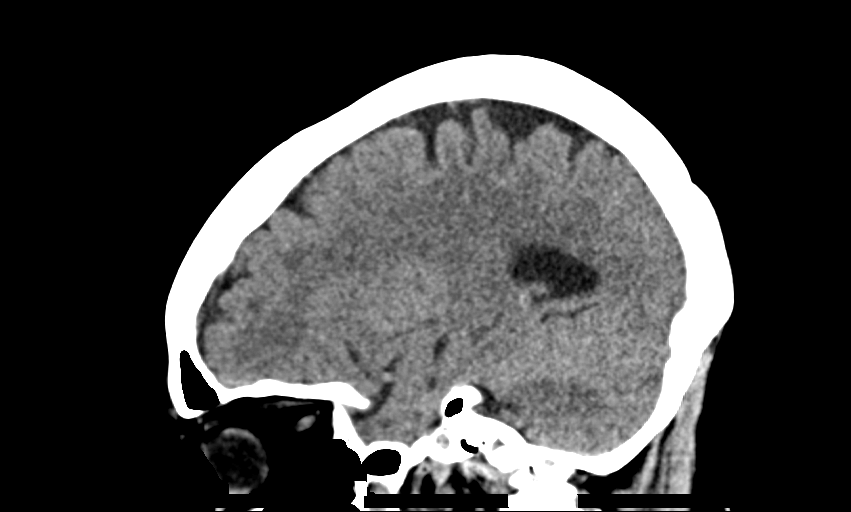
[im 25/49  brain]
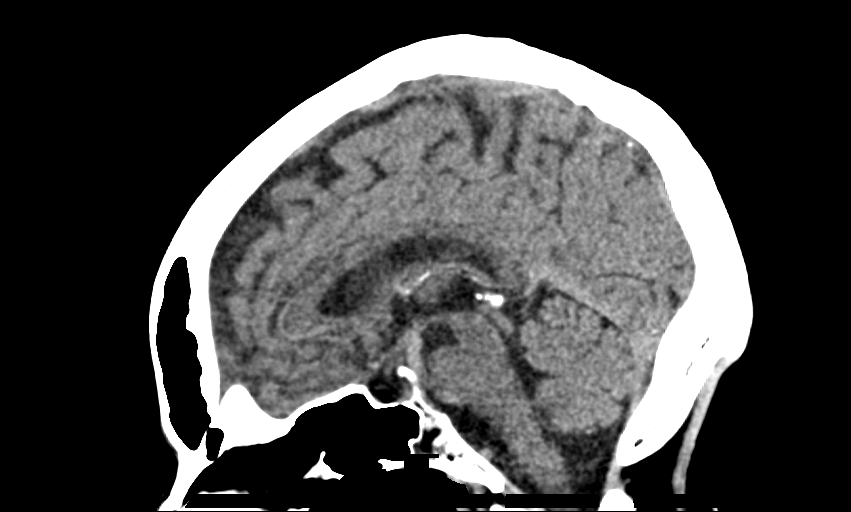
[im 33/49  brain]
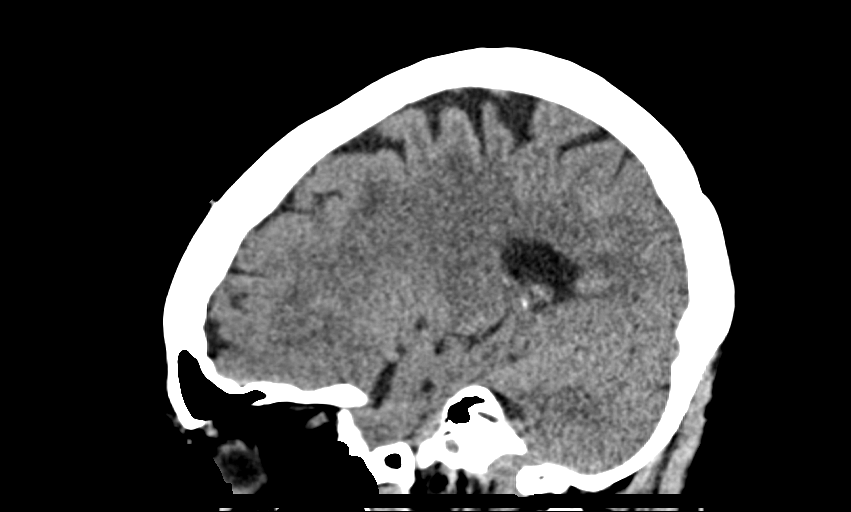

[14 of 47 positions shown; findings below may reference images not displayed]

FINDINGS: Brain: The brainstem and cerebellum are normal. Cerebral hemispheres
show chronic small-vessel ischemic changes of the white matter. No
sign of acute infarction, mass lesion, hemorrhage, hydrocephalus or
extra-axial collection.

Vascular: There is atherosclerotic calcification of the major
vessels at the base of the brain.

Skull: Normal

Sinuses/Orbits: Clear/normal

Other: None
IMPRESSION: No acute finding. Chronic small-vessel ischemic changes of the
cerebral hemispheric white matter.

## 2020-11-25 ENCOUNTER — Emergency Department (HOSPITAL_BASED_OUTPATIENT_CLINIC_OR_DEPARTMENT_OTHER): Payer: BC Managed Care – PPO

## 2020-11-25 ENCOUNTER — Other Ambulatory Visit: Payer: Self-pay

## 2020-11-25 ENCOUNTER — Encounter (HOSPITAL_BASED_OUTPATIENT_CLINIC_OR_DEPARTMENT_OTHER): Payer: Self-pay

## 2020-11-25 ENCOUNTER — Emergency Department (HOSPITAL_BASED_OUTPATIENT_CLINIC_OR_DEPARTMENT_OTHER)
Admission: EM | Admit: 2020-11-25 | Discharge: 2020-11-25 | Disposition: A | Discharge status: Home / Self Care | Payer: BC Managed Care – PPO | Attending: Emergency Medicine | Admitting: Emergency Medicine

## 2020-11-25 ENCOUNTER — Other Ambulatory Visit (HOSPITAL_BASED_OUTPATIENT_CLINIC_OR_DEPARTMENT_OTHER): Payer: Self-pay

## 2020-11-25 DIAGNOSIS — M47817 Spondylosis without myelopathy or radiculopathy, lumbosacral region: Secondary | ICD-10-CM

## 2020-11-25 DIAGNOSIS — R109 Unspecified abdominal pain: Secondary | ICD-10-CM | POA: Insufficient documentation

## 2020-11-25 DIAGNOSIS — M47897 Other spondylosis, lumbosacral region: Secondary | ICD-10-CM | POA: Diagnosis not present

## 2020-11-25 DIAGNOSIS — I10 Essential (primary) hypertension: Secondary | ICD-10-CM | POA: Diagnosis not present

## 2020-11-25 DIAGNOSIS — Z7982 Long term (current) use of aspirin: Secondary | ICD-10-CM | POA: Insufficient documentation

## 2020-11-25 DIAGNOSIS — R10A0 Flank pain, unspecified side: Secondary | ICD-10-CM

## 2020-11-25 DIAGNOSIS — F17210 Nicotine dependence, cigarettes, uncomplicated: Secondary | ICD-10-CM | POA: Insufficient documentation

## 2020-11-25 DIAGNOSIS — Z79899 Other long term (current) drug therapy: Secondary | ICD-10-CM | POA: Insufficient documentation

## 2020-11-25 LAB — HEPATIC FUNCTION PANEL
ALT: 20 U/L (ref 0–44)
AST: 25 U/L (ref 15–41)
Albumin: 4 g/dL (ref 3.5–5.0)
Alkaline Phosphatase: 64 U/L (ref 38–126)
Bilirubin, Direct: <0.1 mg/dL (ref 0.0–0.2)
Total Bilirubin: 0.6 mg/dL (ref 0.3–1.2)
Total Protein: 7.7 g/dL (ref 6.5–8.1)

## 2020-11-25 LAB — BASIC METABOLIC PANEL
Anion gap: 8 (ref 5–15)
BUN: 16 mg/dL (ref 8–23)
CO2: 30 mmol/L (ref 22–32)
Calcium: 9.8 mg/dL (ref 8.9–10.3)
Chloride: 102 mmol/L (ref 98–111)
Creatinine, Ser: 0.95 mg/dL (ref 0.44–1.00)
GFR, Estimated: >60 mL/min (ref >60)
Glucose, Bld: 94 mg/dL (ref 70–99)
Potassium: 3 mmol/L — ABNORMAL LOW (ref 3.5–5.1)
Sodium: 140 mmol/L (ref 135–145)

## 2020-11-25 LAB — URINALYSIS, MICROSCOPIC (REFLEX)

## 2020-11-25 LAB — URINALYSIS, ROUTINE W REFLEX MICROSCOPIC
Bilirubin Urine: NEGATIVE
Glucose, UA: NEGATIVE mg/dL
Ketones, ur: NEGATIVE mg/dL
Leukocytes,Ua: NEGATIVE
Nitrite: NEGATIVE
Protein, ur: 100 mg/dL — AB
Specific Gravity, Urine: 1.02 (ref 1.005–1.030)
pH: 7 (ref 5.0–8.0)

## 2020-11-25 LAB — CBC
HCT: 42.8 % (ref 36.0–46.0)
Hemoglobin: 14 g/dL (ref 12.0–15.0)
MCH: 31 pg (ref 26.0–34.0)
MCHC: 32.7 g/dL (ref 30.0–36.0)
MCV: 94.9 fL (ref 80.0–100.0)
Platelets: 223 10*3/uL (ref 150–400)
RBC: 4.51 MIL/uL (ref 3.87–5.11)
RDW: 14.5 % (ref 11.5–15.5)
WBC: 4.4 10*3/uL (ref 4.0–10.5)
nRBC: 0 % (ref 0.0–0.2)

## 2020-11-25 LAB — LIPASE, BLOOD: Lipase: 28 U/L (ref 11–51)

## 2020-11-25 MED ORDER — DICLOFENAC SODIUM 1 % EX GEL
2.0000 g | Freq: Four times a day (QID) | CUTANEOUS | 0 refills | Status: AC
  Filled 2020-11-25: qty 100, 13d supply, fill #0

## 2020-11-25 MED ORDER — KETOROLAC TROMETHAMINE 15 MG/ML IJ SOLN
15.0000 mg | Freq: Once | INTRAMUSCULAR | Status: AC
  Administered 2020-11-25: 15 mg via INTRAMUSCULAR
  Filled 2020-11-25: qty 1

## 2020-11-25 MED ORDER — ONDANSETRON 4 MG PO TBDP
4.0000 mg | ORAL_TABLET | Freq: Three times a day (TID) | ORAL | 0 refills | Status: AC | PRN
  Filled 2020-11-25: qty 20, 7d supply, fill #0

## 2020-11-25 MED ORDER — LIDOCAINE 5 % EX PTCH
1.0000 | MEDICATED_PATCH | CUTANEOUS | 0 refills | Status: AC
  Filled 2020-11-25: qty 30, 30d supply, fill #0

## 2020-11-25 MED ORDER — ONDANSETRON 4 MG PO TBDP
4.0000 mg | ORAL_TABLET | Freq: Once | ORAL | Status: AC
  Administered 2020-11-25: 4 mg via ORAL
  Filled 2020-11-25: qty 1

## 2020-11-25 NOTE — ED Notes (Signed)
Patient transported to CT 

## 2020-11-25 NOTE — ED Notes (Signed)
Pt unable to void at the moment, has specimen cup.

## 2020-11-25 NOTE — Discharge Instructions (Addendum)
You are seen in the emergency department today for right-sided pain.  As we discussed your lab work was all reassuring today.  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  We normally treat these with anti-inflammatories.  As we discussed I do not really prefer you taking lots of ibuprofen, but she can be taking Tylenol for pain.  Also prescribing you 2 different medicines a Voltaren gel as well as a lidocaine patch, both of these are topical pain relievers.  For your nausea I am prescribing you Zofran, which is the same medicine that you got in the emergency department.  I would make your primary doctor aware of your symptoms and the treatment that we provided, and you all can discuss long-term treatment of the symptoms.  Continue to monitor how you're doing and return to the ER for new or worsening symptoms such as fevers, numbness, tingling, or loss of consciousness.   It has been a pleasure seeing and caring for you today and I hope you start feeling better soon!

## 2020-11-25 NOTE — ED Notes (Signed)
Discharge instructions discussed with pt. Pt verbalized understanding. Pt stable and ambulatory.  °

## 2020-11-25 NOTE — ED Triage Notes (Signed)
Pt reports R sided flank pain since Wednesday. Denies urinary symptoms. Pt reports multiple episodes of emesis since waking this morning.

## 2020-11-25 NOTE — ED Provider Notes (Signed)
[facility] EMERGENCY DEPARTMENT Provider Note   CSN: [id number] Arrival date & time: 11/25/20  1101     History Chief Complaint  Patient presents with   Flank Pain    Valerie Kane is a 70 y.o. female with history of HTN and HLD who presents with right sided flank pain x 3 days.  States that she noticed the pain when she woke up, and describes it as a pressure feeling.  She states that sometimes radiates into her right lower quadrant.  She has been using topical ointments and taking ibuprofen with minimal relief.  She also notes several episodes of lightheadedness and nausea, she attributed to the severe pain.  Patient notes history of sciatica, but states that this pain is more severe.  The pain does not radiate into her legs, and there is no associated numbness or tingling.  No falls or trauma to the area.  She denies urinary symptoms.  She also notes that she has not had a bowel movement since the pain started, but she has been passing gas.  Distant history of partial hysterectomy, no other abdominal surgeries noted.   Flank Pain Pertinent negatives include no chest pain, no abdominal pain and no shortness of breath.      Past Medical History:  Diagnosis Date   High cholesterol    Hypertension    Hypokalemia     There are no problems to display for this patient.   Past Surgical History:  Procedure Laterality Date   PARTIAL HYSTERECTOMY       OB History   No obstetric history on file.     No family history on file.  Social History   Tobacco Use   Smoking status: Every Day    Packs/day: 0.25    Types: Cigarettes   Smokeless tobacco: Never  Vaping Use   Vaping Use: Never used  Substance Use Topics   Alcohol use: No   Drug use: No    Home Medications Prior to Admission medications   Medication Sig Start Date End Date Taking? Authorizing Provider  diclofenac Sodium (VOLTAREN) 1 % GEL Apply 2 grams on to the skin 4 (four) times daily. 11/25/20   Yes Travian Kerner T, PA-C  lidocaine (LIDODERM) 5 % Place 1 patch onto the skin daily. Remove & Discard patch within 12 hours or as directed by MD 11/25/20  Yes Shelagh Rayman T, PA-C  ondansetron (ZOFRAN ODT) 4 MG disintegrating tablet Take 1 tablet (4 mg total) by mouth every 8 (eight) hours as needed for nausea or vomiting. 11/25/20  Yes Chyann Ambrocio T, PA-C  aspirin EC 81 MG tablet Take by mouth.    [provider]  hydrochlorothiazide (HYDRODIURIL) 25 MG tablet Take 25 mg by mouth daily.    [provider]  lisinopril (PRINIVIL,ZESTRIL) 40 MG tablet Take 40 mg by mouth daily.    [provider]  potassium chloride 20 MEQ TBCR Take 20 mEq by mouth 2 (two) times daily. 09/06/16   Rise Mu, PA-C    Allergies    Patient has no active allergies.  Review of Systems   Review of Systems  Constitutional:  Negative for chills and fever.  HENT:  Negative for congestion.   Respiratory:  Negative for cough and shortness of breath.   Cardiovascular:  Negative for chest pain, palpitations and leg swelling.  Gastrointestinal:  Positive for constipation and nausea. Negative for abdominal pain, blood in stool, diarrhea and vomiting.  Genitourinary:  Positive  for flank pain. Negative for dysuria, frequency, hematuria, urgency and vaginal bleeding.  Musculoskeletal:  Positive for back pain.  Neurological:  Positive for light-headedness.  All other systems reviewed and are negative.  Physical Exam Updated Vital Signs BP (!) 169/109 (BP Location: Right Arm)   Pulse 65   Temp 98.7 F (37.1 C) (Oral)   Resp 16   Ht 5\' 5"  (1.651 m)   Wt 65.8 kg   SpO2 97%   BMI 24.13 kg/m   Physical Exam Vitals and nursing note reviewed.  Constitutional:      Appearance: Normal appearance.  HENT:     Head: Normocephalic and atraumatic.  Eyes:     Conjunctiva/sclera: Conjunctivae normal.  Cardiovascular:     Rate and Rhythm: Normal rate and regular rhythm.   Pulmonary:     Effort: Pulmonary effort is normal. No respiratory distress.     Breath sounds: Normal breath sounds.  Abdominal:     General: There is no distension.     Palpations: Abdomen is soft.     Tenderness: There is no abdominal tenderness. There is no right CVA tenderness, left CVA tenderness, guarding or rebound.  Musculoskeletal:     Comments: Mild tenderness to palpation over the right SI joint.  No midline spinal tenderness, step-offs or crepitus.  Skin:    General: Skin is warm and dry.  Neurological:     General: No focal deficit present.     Mental Status: She is alert.     Comments: Full passive range of motion of right hip without reproducible pain or sensory changes.  Pulses equal and normal in BLE. Negative straight leg raise bilaterally.     ED Results / Procedures / Treatments   Labs (all labs ordered are listed, but only abnormal results are displayed) Labs Reviewed  URINALYSIS, ROUTINE W REFLEX MICROSCOPIC - Abnormal; Notable for the following components:      Result Value   APPearance HAZY (*)    Hgb urine dipstick TRACE (*)    Protein, ur 100 (*)    All other components within normal limits  BASIC METABOLIC PANEL - Abnormal; Notable for the following components:   Potassium 3.0 (*)    All other components within normal limits  URINALYSIS, MICROSCOPIC (REFLEX) - Abnormal; Notable for the following components:   Bacteria, UA MANY (*)    All other components within normal limits  URINE CULTURE  CBC  HEPATIC FUNCTION PANEL  LIPASE, BLOOD    EKG None  Radiology CT ABDOMEN PELVIS WO CONTRAST  Result Date: 11/25/2020 CLINICAL DATA:  Abdominal pain, RIGHT flank pain, symptoms since Wednesday, vomiting this morning EXAM: CT ABDOMEN AND PELVIS WITHOUT CONTRAST TECHNIQUE: Multidetector CT imaging of the abdomen and pelvis was performed following the standard protocol without IV contrast. COMPARISON:  None. FINDINGS: Lower chest: Minimal atelectasis at  LEFT lung base. RIGHT lung base clear. Hepatobiliary: Tiny cyst RIGHT lobe liver 6 mm. Additional nonspecific 5 mm RIGHT lobe nodule image 30. Gallbladder and liver otherwise normal appearance. Pancreas: Normal appearance Spleen: Normal appearance. Adrenals/Urinary Tract: Adrenal thickening without mass. Small cyst at inferior pole RIGHT kidney 15 mm diameter. Low descent of urinary bladder in pelvis question cystocele. Kidneys, ureters, and bladder otherwise normal appearance Stomach/Bowel: Normal appendix. Stomach and bowel loops unremarkable. Vascular/Lymphatic: Scattered pelvic phleboliths. Atherosclerotic calcifications aorta and iliac arteries without aneurysm. Tortuosity of aorta and iliac arteries. No adenopathy. Reproductive: Uterus surgically absent. Nonvisualization of ovaries. Other: No free air or free fluid. No  hernia or inflammatory process. Musculoskeletal: Osseous demineralization. Levoconvex lumbar scoliosis. Disc space narrowing and endplate spur formation with facet degenerative changes L5-S1. IMPRESSION: Small RIGHT renal and hepatic cysts. Low descent of urinary bladder in pelvis question cystocele. No acute intra-abdominal or intrapelvic abnormalities. Additional nonspecific 5 mm RIGHT lobe liver nodule. Aortic Atherosclerosis (ICD10-I70.0). Electronically Signed   By: Lavonia Dana M.D.   On: 11/25/2020 12:42    Procedures Procedures   Medications Ordered in ED Medications  ondansetron (ZOFRAN-ODT) disintegrating tablet 4 mg (4 mg Oral Given 11/25/20 1238)  ketorolac (TORADOL) 15 MG/ML injection 15 mg (15 mg Intramuscular Given 11/25/20 1239)    ED Course  I have reviewed the triage vital signs and the nursing notes.  Pertinent labs & imaging results that were available during my care of the patient were reviewed by me and considered in my medical decision making (see chart for details).    MDM Rules/Calculators/A&P                           Patient is 70 year old female with  history of hypertension and hyperlipidemia who presents to the emergency department for right flank pain x3 days.  She denies fevers, chills, numbness, tingling, saddle anesthesia, urinary retention or incontinence that would suggest cauda equina.  On exam patient is afebrile, no acute distress.  She has some mild tenderness to palpation over her right SI joint.  No CVA tenderness. There is no midline tenderness, step-offs or crepitus.  Negative straight leg raise.  Sensation intact in bilateral lower extremities.  Abdomen is non-surgical. No neurologic deficits and neurological exam grossly normal as above.   Based on exam and relatively benign lab work, it appears that symptoms are more likely of a musculoskeletal origin.  Performed a CT of the abdomen which commented on some bony spurs in the level of L5-S1, which could be contributing to symptoms.  Plan to treat symptomatically with topical medications, and recommendations for Tylenol over ibuprofen in an elderly patient.  She is otherwise stable and not requiring admission or inpatient treatment for her symptoms at this time.  Plan to discharged home, discussed reasons to return to the emergency department.  Patient agreeable to plan.  I discussed this case with my attending physician Dr. Laverta Baltimore who cosigned this note including patient's presenting symptoms, physical exam, and planned diagnostics and interventions. Attending physician stated agreement with plan or made changes to plan which were implemented.    Final Clinical Impression(s) / ED Diagnoses Final diagnoses:  Flank pain  Lumbar and sacral arthritis    Rx / DC Orders ED Discharge Orders          Ordered    lidocaine (LIDODERM) 5 %  Every 24 hours        11/25/20 1356    diclofenac Sodium (VOLTAREN) 1 % GEL  4 times daily        11/25/20 1356    ondansetron (ZOFRAN ODT) 4 MG disintegrating tablet  Every 8 hours PRN        11/25/20 1356           Portions of this report may  have been transcribed using voice recognition software. Every effort was made to ensure accuracy; however, inadvertent computerized transcription errors may be present.    Estill Cotta 11/25/20 1420    LongWonda Olds, MD 12/01/20 1824

## 2020-11-26 LAB — URINE CULTURE: Culture: NO GROWTH

## 2021-05-03 ENCOUNTER — Emergency Department (HOSPITAL_BASED_OUTPATIENT_CLINIC_OR_DEPARTMENT_OTHER): Payer: BC Managed Care – PPO

## 2021-05-03 ENCOUNTER — Emergency Department (HOSPITAL_COMMUNITY): Payer: BC Managed Care – PPO

## 2021-05-03 ENCOUNTER — Encounter (HOSPITAL_BASED_OUTPATIENT_CLINIC_OR_DEPARTMENT_OTHER): Payer: Self-pay

## 2021-05-03 ENCOUNTER — Emergency Department (HOSPITAL_BASED_OUTPATIENT_CLINIC_OR_DEPARTMENT_OTHER)
Admission: EM | Admit: 2021-05-03 | Discharge: 2021-05-03 | Disposition: A | Discharge status: Home / Self Care | Payer: BC Managed Care – PPO | Attending: Emergency Medicine | Admitting: Emergency Medicine

## 2021-05-03 DIAGNOSIS — E876 Hypokalemia: Secondary | ICD-10-CM | POA: Diagnosis not present

## 2021-05-03 DIAGNOSIS — M542 Cervicalgia: Secondary | ICD-10-CM | POA: Insufficient documentation

## 2021-05-03 DIAGNOSIS — I10 Essential (primary) hypertension: Secondary | ICD-10-CM | POA: Diagnosis not present

## 2021-05-03 DIAGNOSIS — Z79899 Other long term (current) drug therapy: Secondary | ICD-10-CM | POA: Insufficient documentation

## 2021-05-03 DIAGNOSIS — H532 Diplopia: Secondary | ICD-10-CM | POA: Insufficient documentation

## 2021-05-03 DIAGNOSIS — Z20822 Contact with and (suspected) exposure to covid-19: Secondary | ICD-10-CM | POA: Diagnosis not present

## 2021-05-03 DIAGNOSIS — I639 Cerebral infarction, unspecified: Secondary | ICD-10-CM | POA: Diagnosis not present

## 2021-05-03 DIAGNOSIS — H538 Other visual disturbances: Secondary | ICD-10-CM

## 2021-05-03 LAB — CBC
HCT: 40.3 % (ref 36.0–46.0)
Hemoglobin: 13.2 g/dL (ref 12.0–15.0)
MCH: 31.4 pg (ref 26.0–34.0)
MCHC: 32.8 g/dL (ref 30.0–36.0)
MCV: 96 fL (ref 80.0–100.0)
Platelets: 184 10*3/uL (ref 150–400)
RBC: 4.2 MIL/uL (ref 3.87–5.11)
RDW: 13.8 % (ref 11.5–15.5)
WBC: 4.2 10*3/uL (ref 4.0–10.5)
nRBC: 0 % (ref 0.0–0.2)

## 2021-05-03 LAB — DIFFERENTIAL
Abs Immature Granulocytes: 0.01 10*3/uL (ref 0.00–0.07)
Basophils Absolute: 0 10*3/uL (ref 0.0–0.1)
Basophils Relative: 0 %
Eosinophils Absolute: 0.1 10*3/uL (ref 0.0–0.5)
Eosinophils Relative: 2 %
Immature Granulocytes: 0 %
Lymphocytes Relative: 45 %
Lymphs Abs: 1.9 10*3/uL (ref 0.7–4.0)
Monocytes Absolute: 0.3 10*3/uL (ref 0.1–1.0)
Monocytes Relative: 7 %
Neutro Abs: 2 10*3/uL (ref 1.7–7.7)
Neutrophils Relative %: 46 %

## 2021-05-03 LAB — COMPREHENSIVE METABOLIC PANEL
ALT: 17 U/L (ref 0–44)
AST: 23 U/L (ref 15–41)
Albumin: 4 g/dL (ref 3.5–5.0)
Alkaline Phosphatase: 50 U/L (ref 38–126)
Anion gap: 8 (ref 5–15)
BUN: 24 mg/dL — ABNORMAL HIGH (ref 8–23)
CO2: 29 mmol/L (ref 22–32)
Calcium: 10 mg/dL (ref 8.9–10.3)
Chloride: 104 mmol/L (ref 98–111)
Creatinine, Ser: 1.19 mg/dL — ABNORMAL HIGH (ref 0.44–1.00)
GFR, Estimated: 49 mL/min — ABNORMAL LOW (ref >60)
Glucose, Bld: 89 mg/dL (ref 70–99)
Potassium: 2.8 mmol/L — ABNORMAL LOW (ref 3.5–5.1)
Sodium: 141 mmol/L (ref 135–145)
Total Bilirubin: 0.6 mg/dL (ref 0.3–1.2)
Total Protein: 7.5 g/dL (ref 6.5–8.1)

## 2021-05-03 LAB — APTT: aPTT: 31 seconds (ref 24–36)

## 2021-05-03 LAB — URINALYSIS, ROUTINE W REFLEX MICROSCOPIC
Bilirubin Urine: NEGATIVE
Glucose, UA: NEGATIVE mg/dL
Ketones, ur: NEGATIVE mg/dL
Leukocytes,Ua: NEGATIVE
Nitrite: NEGATIVE
Protein, ur: NEGATIVE mg/dL
Specific Gravity, Urine: 1.02 (ref 1.005–1.030)
pH: 7.5 (ref 5.0–8.0)

## 2021-05-03 LAB — RAPID URINE DRUG SCREEN, HOSP PERFORMED
Amphetamines: NOT DETECTED
Barbiturates: NOT DETECTED
Benzodiazepines: NOT DETECTED
Cocaine: NOT DETECTED
Opiates: NOT DETECTED
Tetrahydrocannabinol: NOT DETECTED

## 2021-05-03 LAB — MAGNESIUM: Magnesium: 2.2 mg/dL (ref 1.7–2.4)

## 2021-05-03 LAB — RESP PANEL BY RT-PCR (FLU A&B, COVID) ARPGX2
Influenza A by PCR: NEGATIVE
Influenza B by PCR: NEGATIVE
SARS Coronavirus 2 by RT PCR: NEGATIVE

## 2021-05-03 LAB — URINALYSIS, MICROSCOPIC (REFLEX)

## 2021-05-03 LAB — PROTIME-INR
INR: 1 (ref 0.8–1.2)
Prothrombin Time: 12.8 seconds (ref 11.4–15.2)

## 2021-05-03 LAB — ETHANOL: Alcohol, Ethyl (B): <10 mg/dL (ref <10)

## 2021-05-03 MED ORDER — LISINOPRIL 20 MG PO TABS
40.0000 mg | ORAL_TABLET | Freq: Once | ORAL | Status: AC
  Administered 2021-05-03: 40 mg via ORAL
  Filled 2021-05-03: qty 2

## 2021-05-03 MED ORDER — POTASSIUM CHLORIDE CRYS ER 20 MEQ PO TBCR
40.0000 meq | EXTENDED_RELEASE_TABLET | Freq: Once | ORAL | Status: AC
Start: 2021-05-03 — End: 2021-05-03
  Administered 2021-05-03: 40 meq via ORAL
  Filled 2021-05-03: qty 2

## 2021-05-03 MED ORDER — IOHEXOL 350 MG/ML SOLN
100.0000 mL | Freq: Once | INTRAVENOUS | Status: AC | PRN
  Administered 2021-05-03: 100 mL via INTRAVENOUS

## 2021-05-03 MED ORDER — LORAZEPAM 2 MG/ML IJ SOLN
1.0000 mg | Freq: Once | INTRAMUSCULAR | Status: AC
  Administered 2021-05-03: 1 mg via INTRAVENOUS
  Filled 2021-05-03: qty 1

## 2021-05-03 MED ORDER — POTASSIUM CHLORIDE 10 MEQ/100ML IV SOLN
10.0000 meq | Freq: Once | INTRAVENOUS | Status: AC
Start: 2021-05-03 — End: 2021-05-03
  Administered 2021-05-03: 10 meq via INTRAVENOUS
  Filled 2021-05-03: qty 100

## 2021-05-03 MED ORDER — SODIUM CHLORIDE 0.9 % IV SOLN: Freq: Once | INTRAVENOUS | Status: AC

## 2021-05-03 MED ORDER — GADOBUTROL 1 MMOL/ML IV SOLN
6.0000 mL | Freq: Once | INTRAVENOUS | Status: AC | PRN
  Administered 2021-05-03: 6 mL via INTRAVENOUS

## 2021-05-03 NOTE — ED Provider Notes (Signed)
?[facility] EMERGENCY DEPARTMENT ?Provider Note ? ? ?CSN: [id number] ?Arrival date & time: 05/03/21  1011 ? ?  ? ?History ? ?Chief Complaint  ?Patient presents with  ? Diplopia  ? ? ?Valerie Kane is a 71 y.o. female. ? ?HPI ?71 year old female with a history of multiple comorbidities including hypertension, hyperlipidemia, previous stroke, presents with double vision.  Symptoms started yesterday with dizziness around 9 AM.  After her nap at around 4 PM she woke up with double vision.  Even if she closes either eye she still has the double vision.  Has a little bit of a headache and feels a little dizzy and feels off balance when walking.  No focal weakness.  No vomiting.  No ocular pain or slurred speech.  Called her doctor this morning who told her to go to the ER. ? ?Of note she has had a little bit of left-sided neck discomfort for a few weeks. ? ?Home Medications ?Prior to Admission medications   ?Medication Sig Start Date End Date Taking? Authorizing Provider  ?aspirin EC 81 MG tablet Take by mouth.    [provider]  ?diclofenac Sodium (VOLTAREN) 1 % GEL Apply 2 grams on to the skin 4 (four) times daily. 11/25/20   Roemhildt, Lorin T, PA-C  ?hydrochlorothiazide (HYDRODIURIL) 25 MG tablet Take 25 mg by mouth daily.    [provider]  ?lidocaine (LIDODERM) 5 % Place 1 patch onto the skin daily. Remove & Discard patch within 12 hours or as directed by MD 11/25/20   Roemhildt, Lorin T, PA-C  ?lisinopril (PRINIVIL,ZESTRIL) 40 MG tablet Take 40 mg by mouth daily.    [provider]  ?ondansetron (ZOFRAN ODT) 4 MG disintegrating tablet Take 1 tablet (4 mg total) by mouth every 8 (eight) hours as needed for nausea or vomiting. 11/25/20   Roemhildt, Lorin T, PA-C  ?potassium chloride 20 MEQ TBCR Take 20 mEq by mouth 2 (two) times daily. 09/06/16   Doristine Devoid, PA-C  ?   ? ?Allergies    ?Patient has no known allergies.   ? ?Review of Systems   ?Review of Systems   ?Constitutional:  Negative for fever.  ?Eyes:  Positive for visual disturbance. Negative for pain.  ?Musculoskeletal:  Positive for gait problem and neck pain.  ?Neurological:  Positive for dizziness and headaches. Negative for speech difficulty, weakness and numbness.  ? ?Physical Exam ?Updated Vital Signs ?BP (!) 187/167   Pulse 62   Temp 98.2 ?F (36.8 ?C) (Oral)   Resp 15   Ht 5\' 5"  (1.651 m)   Wt 63.5 kg   SpO2 100%   BMI 23.31 kg/m?  ?Physical Exam ?Vitals and nursing note reviewed.  ?Constitutional:   ?   Appearance: She is well-developed.  ?HENT:  ?   Head: Normocephalic and atraumatic.  ?Eyes:  ?   Extraocular Movements: Extraocular movements intact.  ?   Pupils: Pupils are equal, round, and reactive to light.  ?   Comments: No obvious extraocular movement deficit.  At rest the eyes seem to be in normal position.  However she reports double vision, even with covering of either eye.  ?Cardiovascular:  ?   Rate and Rhythm: Normal rate and regular rhythm.  ?   Heart sounds: Normal heart sounds.  ?Pulmonary:  ?   Effort: Pulmonary effort is normal.  ?Abdominal:  ?   General: There is no distension.  ?Skin: ?   General: Skin is warm and dry.  ?  Neurological:  ?   Mental Status: She is alert and oriented to person, place, and time.  ?   Comments: CN 3-12 grossly intact. 5/5 strength in all 4 extremities. Grossly normal sensation.  She has some difficulty with finger-to-nose due to the double vision part but once she picks the right finger that she sees, she is able to slowly do finger-to-nose bilaterally.  She can do heel-to-shin though she keeps falling off the side of her leg to the lateral aspect on both legs.  ? ? ?ED Results / Procedures / Treatments   ?Labs ?(all labs ordered are listed, but only abnormal results are displayed) ?Labs Reviewed  ?COMPREHENSIVE METABOLIC PANEL - Abnormal; Notable for the following components:  ?    Result Value  ? Potassium 2.8 (*)   ? BUN 24 (*)   ? Creatinine, Ser 1.19  (*)   ? GFR, Estimated 49 (*)   ? All other components within normal limits  ?URINALYSIS, ROUTINE W REFLEX MICROSCOPIC - Abnormal; Notable for the following components:  ? Hgb urine dipstick TRACE (*)   ? All other components within normal limits  ?URINALYSIS, MICROSCOPIC (REFLEX) - Abnormal; Notable for the following components:  ? Bacteria, UA RARE (*)   ? All other components within normal limits  ?RESP PANEL BY RT-PCR (FLU A&B, COVID) [id number]  ?ETHANOL  ?PROTIME-INR  ?APTT  ?CBC  ?DIFFERENTIAL  ?RAPID URINE DRUG SCREEN, HOSP PERFORMED  ?MAGNESIUM  ? ? ?EKG ?EKG Interpretation ? ?Date/Time:  Wednesday May 03 2021 10:28:42 EDT ?Ventricular Rate:  66 ?PR Interval:  189 ?QRS Duration: 148 ?QT Interval:  449 ?QTC Calculation: 471 ?R Axis:   -30 ?Text Interpretation: Sinus rhythm Right bundle branch block similar to 2020 Confirmed by Sherwood Gambler [phone]) on 05/03/2021 10:51:13 AM ? ?Radiology ?CT ANGIO HEAD NECK W WO CM ? ?Result Date: 05/03/2021 ?CLINICAL DATA:  Diplopia. Dizziness/imbalance. Nausea and intermittent headache. Vertebral artery dissection suspected. EXAM: CT ANGIOGRAPHY HEAD AND NECK TECHNIQUE: Multidetector CT imaging of the head and neck was performed using the standard protocol during bolus administration of intravenous contrast. Multiplanar CT image reconstructions and MIPs were obtained to evaluate the vascular anatomy. Carotid stenosis measurements (when applicable) are obtained utilizing NASCET criteria, using the distal internal carotid diameter as the denominator. RADIATION DOSE REDUCTION: This exam was performed according to the departmental dose-optimization program which includes automated exposure control, adjustment of the mA and/or kV according to patient size and/or use of iterative reconstruction technique. CONTRAST:  115mL OMNIPAQUE IOHEXOL 350 MG/ML SOLN COMPARISON:  Head CT 08/25/2020.  Head MRI 01/26/2018. FINDINGS: CT HEAD FINDINGS Brain: There is no evidence of an acute  infarct, intracranial hemorrhage, mass, midline shift, or extra-axial fluid collection. Hypodensities in the cerebral white matter bilaterally are similar to the prior CT and are nonspecific but compatible with mild chronic small vessel ischemic disease. Mild cerebral atrophy is within normal limits for age. Chronic right cerebellar and basal ganglia lacunar infarcts were better demonstrated on the prior MRI. Vascular: Calcified atherosclerosis at the skull base. Skull: No fracture or suspicious osseous lesion. Sinuses: Paranasal sinuses and mastoid air cells are clear. Orbits: Unremarkable. Review of the MIP images confirms the above findings CTA NECK FINDINGS Aortic arch: Standard 3 vessel aortic arch with mild atherosclerotic plaque. Wide patency of the brachiocephalic and subclavian arteries. Right carotid system: Patent without evidence of stenosis, dissection, or significant atherosclerosis. Left carotid system: Patent without evidence of stenosis or dissection. Minimal atherosclerotic plaque at the carotid bifurcation.  Vertebral arteries: Patent without evidence of stenosis or dissection. Minimal atherosclerotic plaque at the right vertebral origin. Mildly dominant right vertebral artery. Skeleton: Asymmetrically severe left facet arthrosis at C4-5 with grade 2 anterolisthesis. Advanced disc space narrowing at C4-5, C5-6, and C6-7. Other neck: Symmetrically prominent soft tissue at the tongue base, likely lingual tonsillar hypertrophy. Heterogeneous thyroid gland containing numerous nodules bilaterally with mild asymmetric enlargement of the left thyroid lobe. Upper chest: Mild biapical pleuroparenchymal lung scarring. Review of the MIP images confirms the above findings CTA HEAD FINDINGS Anterior circulation: The internal carotid arteries are patent from skull base to carotid termini with atherosclerotic plaque resulting in mild right and mild-to-moderate left paraclinoid stenoses. There is slight fusiform  dilatation of the left ICA immediately distal to this stenosis measuring 4 mm in diameter, however a discrete saccular aneurysm is not identified. ACAs and MCAs are patent with mild branch vessel irregularity but no evidence

## 2021-05-03 NOTE — Discharge Instructions (Addendum)
Stop taking the hydrochlorothiazide for now.  See your doctor next week for checkup including blood pressure and potassium level.  Call the ophthalmologist for an appointment to be seen about trouble reading, and vision changes.  Try to eat foods which contain more potassium.  Return here, if needed. ?

## 2021-05-03 NOTE — ED Notes (Signed)
Unsteady gait when ambulating to triage ?

## 2021-05-03 NOTE — ED Provider Notes (Signed)
8:55 PM-MRI brain negative.  Patient states diplopia resolved.  Currently ambulates normally, independently.  Pupils equal round reactive to light.  EOMI.  She denies headache, weakness or dizziness. ? ?Findings discussed with the patient.  She is currently taking potassium, but has low potassium level.  She is on HCTZ for high blood pressure.  Patient complains of blurred vision when reading on her phone.  Daughter at bedside with patient.  Will refer to ophthalmology for vision evaluation. ? ?MDM-probably have resolved.  Case discussed with ophthalmology.  No further work-up required for diplopia in the ED.  Doubt CVA.  Doubt serious metabolic instability.  We will have patient increase potassium to 40 mEq, twice daily for 3 days then resume 20 mg once twice daily.  She is to follow-up with her PCP next week for a potassium check and with ophthalmology for evaluation of blurred vision. ?  ?Daleen Bo, MD ?05/03/21 2102 ? ?

## 2021-05-03 NOTE — ED Notes (Signed)
Patient felt unsteady while ambulating in hall ?She felt the need to reach out to the rail for balance.  ?Patient stated that her double vision is no longer present  ?

## 2021-05-03 NOTE — ED Provider Notes (Signed)
Patient being sent from [facility] for evaluation with MRI for vision changes, difficulty walking and double vision.  Here patient denies double vision at this time but reports her vision still seems blurry and she seems off balance.  She is also complaining of some numbness in her right cheek which she reports has just recently started.  No deficits in her upper or lower extremities.  Patient has not had her blood pressure medication today which she typically takes lisinopril and hydrochlorothiazide but will treat with 40 mg of lisinopril as she is hypertensive here.  Also patient's potassium was replaced at [facility] she was found to have potassium of 2.8.  Patient works 5 to 6 days a week and reports she has not been eating the best.  MRI pending.  She feels she may need Ativan for the MRI and that was ordered. ?  Gwyneth Sprout, MD ?05/03/21 1542 ? ?

## 2021-05-03 NOTE — ED Triage Notes (Signed)
States at 0900 yesterday she felt dizzy/off balance. Took a nap and around 1600 yesterday started seeing double. Been more fatigued the last week. Also c/o nausea and intermittent headache. ?

## 2021-05-03 NOTE — ED Notes (Signed)
Patient transported to MRI 

## 2021-05-03 NOTE — ED Notes (Signed)
Pt transported to CT ?

## 2021-05-03 NOTE — ED Notes (Signed)
ED Provider at bedside. 

## 2021-07-20 ENCOUNTER — Observation Stay (HOSPITAL_BASED_OUTPATIENT_CLINIC_OR_DEPARTMENT_OTHER)
Admission: EM | Admit: 2021-07-20 | Discharge: 2021-07-21 | Disposition: A | Discharge status: Home / Self Care | Payer: Medicare Other | Attending: Internal Medicine | Admitting: Internal Medicine

## 2021-07-20 ENCOUNTER — Encounter (HOSPITAL_BASED_OUTPATIENT_CLINIC_OR_DEPARTMENT_OTHER): Payer: Self-pay | Admitting: Urology

## 2021-07-20 ENCOUNTER — Other Ambulatory Visit: Payer: Self-pay

## 2021-07-20 DIAGNOSIS — Z79899 Other long term (current) drug therapy: Secondary | ICD-10-CM | POA: Insufficient documentation

## 2021-07-20 DIAGNOSIS — E876 Hypokalemia: Principal | ICD-10-CM | POA: Diagnosis present

## 2021-07-20 DIAGNOSIS — I10 Essential (primary) hypertension: Secondary | ICD-10-CM | POA: Insufficient documentation

## 2021-07-20 DIAGNOSIS — F17210 Nicotine dependence, cigarettes, uncomplicated: Secondary | ICD-10-CM | POA: Insufficient documentation

## 2021-07-20 DIAGNOSIS — Z7982 Long term (current) use of aspirin: Secondary | ICD-10-CM | POA: Insufficient documentation

## 2021-07-20 DIAGNOSIS — R252 Cramp and spasm: Secondary | ICD-10-CM

## 2021-07-20 DIAGNOSIS — M79605 Pain in left leg: Secondary | ICD-10-CM | POA: Diagnosis present

## 2021-07-20 NOTE — ED Triage Notes (Signed)
Left leg pain from hip down to foot that started tonight, cramping pain  States had back pain yesterday  H/o sciatic nerve pain, states has concern for low potassium

## 2021-07-21 ENCOUNTER — Encounter (HOSPITAL_BASED_OUTPATIENT_CLINIC_OR_DEPARTMENT_OTHER): Payer: Self-pay | Admitting: Emergency Medicine

## 2021-07-21 DIAGNOSIS — Z79899 Other long term (current) drug therapy: Secondary | ICD-10-CM | POA: Diagnosis not present

## 2021-07-21 DIAGNOSIS — I10 Essential (primary) hypertension: Secondary | ICD-10-CM | POA: Diagnosis not present

## 2021-07-21 DIAGNOSIS — Z7982 Long term (current) use of aspirin: Secondary | ICD-10-CM | POA: Diagnosis not present

## 2021-07-21 DIAGNOSIS — M79605 Pain in left leg: Secondary | ICD-10-CM | POA: Diagnosis present

## 2021-07-21 DIAGNOSIS — F17210 Nicotine dependence, cigarettes, uncomplicated: Secondary | ICD-10-CM | POA: Diagnosis not present

## 2021-07-21 DIAGNOSIS — E876 Hypokalemia: Secondary | ICD-10-CM | POA: Diagnosis not present

## 2021-07-21 LAB — BASIC METABOLIC PANEL
Anion gap: 4 — ABNORMAL LOW (ref 5–15)
Anion gap: 6 (ref 5–15)
BUN: 21 mg/dL (ref 8–23)
BUN: 23 mg/dL (ref 8–23)
CO2: 26 mmol/L (ref 22–32)
CO2: 28 mmol/L (ref 22–32)
Calcium: 9.1 mg/dL (ref 8.9–10.3)
Calcium: 9.5 mg/dL (ref 8.9–10.3)
Chloride: 105 mmol/L (ref 98–111)
Chloride: 110 mmol/L (ref 98–111)
Creatinine, Ser: 1.12 mg/dL — ABNORMAL HIGH (ref 0.44–1.00)
Creatinine, Ser: 1.23 mg/dL — ABNORMAL HIGH (ref 0.44–1.00)
GFR, Estimated: 47 mL/min — ABNORMAL LOW (ref >60)
GFR, Estimated: 52 mL/min — ABNORMAL LOW (ref >60)
Glucose, Bld: 144 mg/dL — ABNORMAL HIGH (ref 70–99)
Glucose, Bld: 90 mg/dL (ref 70–99)
Potassium: 2.3 mmol/L — CL (ref 3.5–5.1)
Potassium: 3.4 mmol/L — ABNORMAL LOW (ref 3.5–5.1)
Sodium: 139 mmol/L (ref 135–145)
Sodium: 140 mmol/L (ref 135–145)

## 2021-07-21 LAB — CBC WITH DIFFERENTIAL/PLATELET
Abs Immature Granulocytes: 0.01 10*3/uL (ref 0.00–0.07)
Basophils Absolute: 0 10*3/uL (ref 0.0–0.1)
Basophils Relative: 1 %
Eosinophils Absolute: 0.2 10*3/uL (ref 0.0–0.5)
Eosinophils Relative: 3 %
HCT: 36.3 % (ref 36.0–46.0)
Hemoglobin: 12.3 g/dL (ref 12.0–15.0)
Immature Granulocytes: 0 %
Lymphocytes Relative: 50 %
Lymphs Abs: 2.9 10*3/uL (ref 0.7–4.0)
MCH: 31.5 pg (ref 26.0–34.0)
MCHC: 33.9 g/dL (ref 30.0–36.0)
MCV: 93.1 fL (ref 80.0–100.0)
Monocytes Absolute: 0.3 10*3/uL (ref 0.1–1.0)
Monocytes Relative: 6 %
Neutro Abs: 2.2 10*3/uL (ref 1.7–7.7)
Neutrophils Relative %: 40 %
Platelets: 198 10*3/uL (ref 150–400)
RBC: 3.9 MIL/uL (ref 3.87–5.11)
RDW: 13.6 % (ref 11.5–15.5)
WBC: 5.6 10*3/uL (ref 4.0–10.5)
nRBC: 0 % (ref 0.0–0.2)

## 2021-07-21 LAB — MAGNESIUM: Magnesium: 2 mg/dL (ref 1.7–2.4)

## 2021-07-21 MED ORDER — LISINOPRIL 10 MG PO TABS
40.0000 mg | ORAL_TABLET | Freq: Once | ORAL | Status: AC
  Administered 2021-07-21: 40 mg via ORAL
  Filled 2021-07-21: qty 4

## 2021-07-21 MED ORDER — AMLODIPINE BESYLATE 5 MG PO TABS: 5.0000 mg | ORAL_TABLET | Freq: Every day | ORAL | 3 refills | Status: AC

## 2021-07-21 MED ORDER — LOSARTAN POTASSIUM 100 MG PO TABS: 100.0000 mg | ORAL_TABLET | Freq: Every day | ORAL | 3 refills | Status: AC

## 2021-07-21 MED ORDER — MAGNESIUM SULFATE 2 GM/50ML IV SOLN
2.0000 g | Freq: Once | INTRAVENOUS | Status: AC
  Administered 2021-07-21: 2 g via INTRAVENOUS
  Filled 2021-07-21: qty 50

## 2021-07-21 MED ORDER — POTASSIUM CHLORIDE CRYS ER 20 MEQ PO TBCR
80.0000 meq | EXTENDED_RELEASE_TABLET | Freq: Once | ORAL | Status: DC
  Filled 2021-07-21: qty 4

## 2021-07-21 MED ORDER — POTASSIUM CHLORIDE CRYS ER 20 MEQ PO TBCR
40.0000 meq | EXTENDED_RELEASE_TABLET | Freq: Once | ORAL | Status: AC
  Administered 2021-07-21: 40 meq via ORAL
  Filled 2021-07-21: qty 2

## 2021-07-21 MED ORDER — POTASSIUM CHLORIDE 10 MEQ/100ML IV SOLN
10.0000 meq | INTRAVENOUS | Status: AC
  Administered 2021-07-21 (×4): 10 meq via INTRAVENOUS
  Filled 2021-07-21 (×4): qty 100

## 2021-07-21 MED ORDER — KETOROLAC TROMETHAMINE 30 MG/ML IJ SOLN
15.0000 mg | Freq: Once | INTRAMUSCULAR | Status: AC
  Administered 2021-07-21: 15 mg via INTRAVENOUS
  Filled 2021-07-21: qty 1

## 2021-07-21 MED ORDER — SODIUM CHLORIDE 0.9 % IV SOLN: INTRAVENOUS | Status: DC | PRN

## 2021-07-21 NOTE — ED Provider Notes (Signed)
[facility] EMERGENCY DEPARTMENT Provider Note   CSN: [id number] Arrival date & time: 07/20/21  2342     History  Chief Complaint  Patient presents with   Leg Pain    Valerie Kane is a 71 y.o. female.  The history is provided by the patient.  Leg Pain Lower extremity pain location: LLE. Time since incident:  6 hours Injury: no   Pain details:    Quality:  Cramping   Radiates to:  Does not radiate   Severity:  Severe   Onset quality:  Sudden   Timing:  Constant   Progression:  Unchanged Chronicity:  New Prior injury to area:  No Relieved by:  Nothing Worsened by:  Nothing Ineffective treatments:  None tried Associated symptoms: no back pain and no fever   Risk factors: no concern for non-accidental trauma   Patient states she believes this pain is related to hypokalemia.  She has been out of her potassium since Monday.  She is not currently on diuretics.       Home Medications Prior to Admission medications   Medication Sig Start Date End Date Taking? Authorizing Provider  aspirin EC 81 MG tablet Take by mouth.    [provider]  diclofenac Sodium (VOLTAREN) 1 % GEL Apply 2 grams on to the skin 4 (four) times daily. 11/25/20   Roemhildt, Lorin T, PA-C  lidocaine (LIDODERM) 5 % Place 1 patch onto the skin daily. Remove & Discard patch within 12 hours or as directed by MD 11/25/20   Roemhildt, Lorin T, PA-C  lisinopril (PRINIVIL,ZESTRIL) 40 MG tablet Take 40 mg by mouth daily.    [provider]  ondansetron (ZOFRAN ODT) 4 MG disintegrating tablet Take 1 tablet (4 mg total) by mouth every 8 (eight) hours as needed for nausea or vomiting. 11/25/20   Roemhildt, Lorin T, PA-C  potassium chloride 20 MEQ TBCR Take 20 mEq by mouth 2 (two) times daily. 09/06/16   Rise Mu, PA-C      Allergies    Patient has no known allergies.    Review of Systems   Review of Systems  Constitutional:  Negative for fever.  HENT:  Negative for  facial swelling.   Eyes:  Negative for redness.  Respiratory:  Negative for wheezing and stridor.   Musculoskeletal:  Positive for arthralgias. Negative for back pain.  All other systems reviewed and are negative.   Physical Exam Updated Vital Signs BP (!) 151/106 (BP Location: Right Arm)   Pulse 89   Temp 98.2 F (36.8 C) (Oral)   Resp 20   Ht 5\' 5"  (1.651 m)   Wt 66.3 kg   SpO2 100%   BMI 24.33 kg/m  Physical Exam Vitals and nursing note reviewed.  Constitutional:      General: She is not in acute distress.    Appearance: She is well-developed.  HENT:     Head: Normocephalic and atraumatic.     Nose: Nose normal.  Eyes:     Pupils: Pupils are equal, round, and reactive to light.  Cardiovascular:     Rate and Rhythm: Normal rate and regular rhythm.     Pulses: Normal pulses.     Heart sounds: Normal heart sounds.  Pulmonary:     Effort: Pulmonary effort is normal. No respiratory distress.     Breath sounds: Normal breath sounds.  Abdominal:     General: Abdomen is flat. Bowel sounds are normal. There is no distension.  Palpations: Abdomen is soft.     Tenderness: There is no abdominal tenderness. There is no guarding or rebound.  Genitourinary:    Vagina: No vaginal discharge.  Musculoskeletal:        General: No tenderness or deformity. Normal range of motion.     Cervical back: Neck supple.  Skin:    General: Skin is warm and dry.     Capillary Refill: Capillary refill takes less than 2 seconds.     Findings: No erythema or rash.  Neurological:     General: No focal deficit present.     Mental Status: She is oriented to person, place, and time.     ED Results / Procedures / Treatments   Labs (all labs ordered are listed, but only abnormal results are displayed) Results for orders placed or performed during the hospital encounter of [id number]  Basic metabolic panel  Result Value Ref Range   Sodium 139 135 - 145 mmol/L   Potassium 2.3 (LL) 3.5 - 5.1  mmol/L   Chloride 105 98 - 111 mmol/L   CO2 28 22 - 32 mmol/L   Glucose, Bld 144 (H) 70 - 99 mg/dL   BUN 23 8 - 23 mg/dL   Creatinine, Ser 1.23 (H) 0.44 - 1.00 mg/dL   Calcium 9.5 8.9 - 10.3 mg/dL   GFR, Estimated 47 (L) >60 mL/min   Anion gap 6 5 - 15  CBC with Differential  Result Value Ref Range   WBC 5.6 4.0 - 10.5 K/uL   RBC 3.90 3.87 - 5.11 MIL/uL   Hemoglobin 12.3 12.0 - 15.0 g/dL   HCT 36.3 36.0 - 46.0 %   MCV 93.1 80.0 - 100.0 fL   MCH 31.5 26.0 - 34.0 pg   MCHC 33.9 30.0 - 36.0 g/dL   RDW 13.6 11.5 - 15.5 %   Platelets 198 150 - 400 K/uL   nRBC 0.0 0.0 - 0.2 %   Neutrophils Relative % 40 %   Neutro Abs 2.2 1.7 - 7.7 K/uL   Lymphocytes Relative 50 %   Lymphs Abs 2.9 0.7 - 4.0 K/uL   Monocytes Relative 6 %   Monocytes Absolute 0.3 0.1 - 1.0 K/uL   Eosinophils Relative 3 %   Eosinophils Absolute 0.2 0.0 - 0.5 K/uL   Basophils Relative 1 %   Basophils Absolute 0.0 0.0 - 0.1 K/uL   Immature Granulocytes 0 %   Abs Immature Granulocytes 0.01 0.00 - 0.07 K/uL  Magnesium  Result Value Ref Range   Magnesium 2.0 1.7 - 2.4 mg/dL   No results found.  EKG EKG Interpretation  Date/Time:  Friday July 21 2021 00:35:33 EDT Ventricular Rate:  73 PR Interval:  175 QRS Duration: 143 QT Interval:  436 QTC Calculation: 481 R Axis:   -37 Text Interpretation: Sinus rhythm Probable left atrial enlargement Right bundle branch block Left ventricular hypertrophy Confirmed by Randal Buba, Alita Waldren ([id number]) on 07/21/2021 12:54:34 AM  Radiology No results found.  Procedures Procedures    Medications Ordered in ED Medications  magnesium sulfate IVPB 2 g 50 mL ( Intravenous Infusion Verify 07/21/21 0202)  potassium chloride 10 mEq in 100 mL IVPB (has no administration in time range)  0.9 %  sodium chloride infusion ( Intravenous New Bag/Given 07/21/21 0122)  ketorolac (TORADOL) 30 MG/ML injection 15 mg (15 mg Intravenous Given 07/21/21 0204)  potassium chloride SA (KLOR-CON M) CR tablet  40 mEq (40 mEq Oral Given 07/21/21 0202)    ED Course/ Medical  Decision Making/ A&P                           Medical Decision Making Patient with h/o low potassium presents with leg cramping.    Problems Addressed: Hypokalemia:    Details: deficit calculted at 255 mEQ magnesium and IV potassium as well as oral initiated  Amount and/or Complexity of Data Reviewed External Data Reviewed: notes.    Details: previous notes reviewed Labs: ordered.    Details: All labs reviewed:  Normal magnesiu, low potassium 2.3 and elevated creatinine 1.23.  normal CBC ECG/medicine tests: ordered and independent interpretation performed.  Risk Prescription drug management. Decision regarding hospitalization. Risk Details: Plan to admit for potassium     Final Clinical Impression(s) / ED Diagnoses Final diagnoses:  Leg cramps  Hypokalemia   The patient appears reasonably stabilized for admission considering the current resources, flow, and capabilities available in the ED at this time, and I doubt any other [facility] requiring further screening and/or treatment in the ED prior to admission.  Rx / DC Orders ED Discharge Orders     None         Burch Marchuk, MD 07/21/21 4081

## 2021-07-21 NOTE — ED Notes (Signed)
Veneka Word, pt's daughter, call and notify of any changes or update when bed is available  [phone]

## 2021-07-21 NOTE — H&P (Signed)
History and Physical    Valerie Kane MWU:[id number] DOB: 13-Nov-1950 DOA: 07/20/2021  PCP: Aileen Fass, PA-C (Confirm with patient/family/NH records and if not entered, this has to be entered at [facility] point of entry) Patient coming from: Home  I have personally briefly reviewed patient's old medical records in [facility]  Chief Complaint: Feeling better  HPI: Valerie Kane is a 71 y.o. female with medical history significant of HTN, chronic hypokalemia, HLD, sciatica, vertigo, presented with worsening of generalized weakness.  Symptoms started yesterday, with generalized weakness and leg cramping.  She came to ED and blood work found her potassium 2.3.  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  But 4 days ago, patient ran out of her losartan and for the last 4 days she has been taking her leftover HCTZ-lisinopril for blood pressure control.   Review of Systems: As per HPI otherwise 14 point review of systems negative.    Past Medical History:  Diagnosis Date   High cholesterol    Hypertension    Hypokalemia     Past Surgical History:  Procedure Laterality Date   PARTIAL HYSTERECTOMY       reports that she has been smoking cigarettes. She has been smoking an average of .25 packs per day. She has never used smokeless tobacco. She reports that she does not drink alcohol and does not use drugs.  No Known Allergies  History reviewed. No pertinent family history.   Prior to Admission medications   Medication Sig Start Date End Date Taking? Authorizing Provider  amLODipine (NORVASC) 5 MG tablet Take 1 tablet (5 mg total) by mouth daily. 07/21/21 07/21/22 Yes Talmadge Ganas, Renae Fickle, MD  losartan (COZAAR) 100 MG tablet Take 1 tablet (100 mg total) by mouth daily. 07/21/21 07/21/22 Yes Emeline General, MD  aspirin EC 81 MG tablet Take by  mouth.    [provider]  diclofenac Sodium (VOLTAREN) 1 % GEL Apply 2 grams on to the skin 4 (four) times daily. 11/25/20   Roemhildt, Lorin T, PA-C  lidocaine (LIDODERM) 5 % Place 1 patch onto the skin daily. Remove & Discard patch within 12 hours or as directed by MD 11/25/20   Roemhildt, Lorin T, PA-C  lisinopril (PRINIVIL,ZESTRIL) 40 MG tablet Take 40 mg by mouth daily.    [provider]  ondansetron (ZOFRAN ODT) 4 MG disintegrating tablet Take 1 tablet (4 mg total) by mouth every 8 (eight) hours as needed for nausea or vomiting. 11/25/20   Roemhildt, Lorin T, PA-C  potassium chloride 20 MEQ TBCR Take 20 mEq by mouth 2 (two) times daily. 09/06/16   Rise Mu, PA-C    Physical Exam: Vitals:   07/21/21 0730 07/21/21 1000 07/21/21 1152 07/21/21 1159  BP: (!) 152/93 (!) 165/105 (!) (P) 164/99 (!) 164/99  Pulse: (!) 57 (!) 54 (P) 65 62  Resp: 13 (!) 22    Temp:    97.9 F (36.6 C)  TempSrc:    Oral  SpO2: 98% 97%    Weight:      Height:        Constitutional: NAD, calm, comfortable Vitals:   07/21/21 0730 07/21/21 1000 07/21/21 1152 07/21/21 1159  BP: (!) 152/93 (!) 165/105 (!) (P) 164/99 (!) 164/99  Pulse: (!) 57 (!) 54 (P) 65 62  Resp: 13 (!) 22    Temp:  97.9 F (36.6 C)  TempSrc:    Oral  SpO2: 98% 97%    Weight:      Height:       Eyes: PERRL, lids and conjunctivae normal ENMT: Mucous membranes are moist. Posterior pharynx clear of any exudate or lesions.Normal dentition.  Neck: normal, supple, no masses, no thyromegaly Respiratory: clear to auscultation bilaterally, no wheezing, no crackles. Normal respiratory effort. No accessory muscle use.  Cardiovascular: Regular rate and rhythm, no murmurs / rubs / gallops. No extremity edema. 2+ pedal pulses. No carotid bruits.  Abdomen: no tenderness, no masses palpated. No hepatosplenomegaly. Bowel sounds positive.  Musculoskeletal: no clubbing / cyanosis. No joint deformity upper and lower  extremities. Good ROM, no contractures. Normal muscle tone.  Skin: no rashes, lesions, ulcers. No induration Neurologic: CN 2-12 grossly intact. Sensation intact, DTR normal. Strength 5/5 in all 4.  Psychiatric: Normal judgment and insight. Alert and oriented x 3. Normal mood.     Labs on Admission: I have personally reviewed following labs and imaging studies  CBC: Recent Labs  Lab 07/20/21 2351  WBC 5.6  NEUTROABS 2.2  HGB 12.3  HCT 36.3  MCV 93.1  PLT 198   Basic Metabolic Panel: Recent Labs  Lab 07/20/21 2351 07/21/21 0916  NA 139 140  K 2.3* 3.4*  CL 105 110  CO2 28 26  GLUCOSE 144* 90  BUN 23 21  CREATININE 1.23* 1.12*  CALCIUM 9.5 9.1  MG 2.0  --    GFR: Estimated Creatinine Clearance: 40.9 mL/min (A) (by C-G formula based on SCr of 1.12 mg/dL (H)). Liver Function Tests: No results for input(s): "AST", "ALT", "ALKPHOS", "BILITOT", "PROT", "ALBUMIN" in the last 168 hours. No results for input(s): "LIPASE", "AMYLASE" in the last 168 hours. No results for input(s): "AMMONIA" in the last 168 hours. Coagulation Profile: No results for input(s): "INR", "PROTIME" in the last 168 hours. Cardiac Enzymes: No results for input(s): "CKTOTAL", "CKMB", "CKMBINDEX", "TROPONINI" in the last 168 hours. BNP (last 3 results) No results for input(s): "PROBNP" in the last 8760 hours. HbA1C: No results for input(s): "HGBA1C" in the last 72 hours. CBG: No results for input(s): "GLUCAP" in the last 168 hours. Lipid Profile: No results for input(s): "CHOL", "HDL", "LDLCALC", "TRIG", "CHOLHDL", "LDLDIRECT" in the last 72 hours. Thyroid Function Tests: No results for input(s): "TSH", "T4TOTAL", "FREET4", "T3FREE", "THYROIDAB" in the last 72 hours. Anemia Panel: No results for input(s): "VITAMINB12", "FOLATE", "FERRITIN", "TIBC", "IRON", "RETICCTPCT" in the last 72 hours. Urine analysis:    Component Value Date/Time   COLORURINE YELLOW 05/03/2021 1140   APPEARANCEUR CLEAR  05/03/2021 1140   LABSPEC 1.020 05/03/2021 1140   PHURINE 7.5 05/03/2021 1140   GLUCOSEU NEGATIVE 05/03/2021 1140   HGBUR TRACE (A) 05/03/2021 1140   BILIRUBINUR NEGATIVE 05/03/2021 1140   KETONESUR NEGATIVE 05/03/2021 1140   PROTEINUR NEGATIVE 05/03/2021 1140   UROBILINOGEN 1.0 08/09/2013 2222   NITRITE NEGATIVE 05/03/2021 1140   LEUKOCYTESUR NEGATIVE 05/03/2021 1140    Radiological Exams on Admission: No results found.  EKG: Independently reviewed.  Sinus no acute ST changes. Assessment/Plan Principal Problem:   Hypokalemia  (please populate well all problems here in Problem List. (For example, if patient is on BP meds at home and you resume or decide to hold them, it is a problem that needs to be her. Same for CAD, COPD, HLD and so on)  Hypokalemia -Likely related to HCTZ, appears that patient is present stable to HCTZ and her hypokalemia closely  related to HCTZ use.  Educated her about she should stop taking HCTZ altogether and not going back in the future and patient and her daughter expressed understanding and agreed. -Continue losartan and potassium supplement.  Recommend follow-up with PCP 3 to 5 days to repeat K level.  HTN -Stable on amlodipine and losartan, will continue.  Sciatica and legs cramping -More or less related to hypokalemia episode, symptoms resolved along with improvement of potassium level.  DVT prophylaxis: SCD Code Status: Full code Family Communication: Daughter at bedside Disposition Plan: Expect less than 2 midnight hospital stay Consults called: None Admission status: MedSurg observation   Emeline General MD [facility] Pager [phone]  07/21/2021, 1:58 PM

## 2021-07-21 NOTE — Discharge Summary (Signed)
Physician Discharge Summary   Patient: Valerie Kane MRN: [id number] DOB: [birth year]  Admit date:     07/20/2021  Discharge date: 07/21/21  Discharge Physician: Emeline General   PCP: Aileen Fass, PA-C   Recommendations at discharge:   Hypokalemia, likely related to HCTZ use which is known to the patient and her family.  However unfortunately patient ran out of her losartan and went back to take HCTZ for last 4 days and caused today's episode of hypokalemia.  Patient and her family both educated about not going back to HCTZ to avoid future hyperkalemia episode.  Recommend follow-up with PCP in 3 to 5 days to repeat BMP. HTN, recommend continue losartan and amlodipine.  Discharge Diagnoses: Principal Problem:   Hypokalemia  Resolved Problems: Hypokalemia  Hospital Course:  71 year old female patient with past medical history of HTN, chronic hypokalemia, HLD, presented with recurrent severe hypokalemia, work-up showed episode likely related to recurrent HCTZ use.  To avoid future episode, patient was educated not to go back to HCTZ for her blood pressure control, patient and her daughter expressed understanding and agreed.  Assessment and Plan:  Recommended follow-up with PCP in 3 to 5 days to repeat blood work of BMP.      Consultants: None Procedures performed: None Disposition: Home Diet recommendation:  Cardiac diet DISCHARGE MEDICATION: Allergies as of 07/21/2021   No Known Allergies      Medication List     STOP taking these medications    lisinopril 40 MG tablet Commonly known as: ZESTRIL       TAKE these medications    amLODipine 5 MG tablet Commonly known as: NORVASC Take 1 tablet (5 mg total) by mouth daily.   aspirin EC 81 MG tablet Take by mouth.   diclofenac Sodium 1 % Gel Commonly known as: VOLTAREN Apply 2 grams on to the skin 4 (four) times daily.   lidocaine 5 % Commonly known as: Lidoderm Place 1 patch onto the skin  daily. Remove & Discard patch within 12 hours or as directed by MD   losartan 100 MG tablet Commonly known as: Cozaar Take 1 tablet (100 mg total) by mouth daily.   ondansetron 4 MG disintegrating tablet Commonly known as: Zofran ODT Take 1 tablet (4 mg total) by mouth every 8 (eight) hours as needed for nausea or vomiting.   Potassium Chloride ER 20 MEQ Tbcr Take 20 mEq by mouth 2 (two) times daily.        Discharge Exam: Filed Weights   07/20/21 2349  Weight: 66.3 kg     Condition at discharge: good  The results of significant diagnostics from this hospitalization (including imaging, microbiology, ancillary and laboratory) are listed below for reference.   Imaging Studies: No results found.  Microbiology: Results for orders placed or performed during the hospital encounter of 05/03/21  Resp Panel by RT-PCR (Flu A&B, Covid) Nasopharyngeal Swab     Status: None   Collection Time: 05/03/21 10:51 AM   Specimen: Nasopharyngeal Swab; Nasopharyngeal(NP) swabs in vial transport medium  Result Value Ref Range Status   SARS Coronavirus 2 by RT PCR NEGATIVE NEGATIVE Final    Comment: (NOTE) SARS-CoV-2 target nucleic acids are NOT DETECTED.  The SARS-CoV-2 RNA is generally detectable in upper respiratory specimens during the acute phase of infection. The lowest concentration of SARS-CoV-2 viral copies this assay can detect is 138 copies/mL. A negative result does not preclude SARS-Cov-2 infection and should not be used as the sole basis  for treatment or other patient management decisions. A negative result may occur with  improper specimen collection/handling, submission of specimen other than nasopharyngeal swab, presence of viral mutation(s) within the areas targeted by this assay, and inadequate number of viral copies(<138 copies/mL). A negative result must be combined with clinical observations, patient history, and epidemiological information. The expected result is  Negative.  Fact Sheet for Patients:  BloggerCourse.com  Fact Sheet for Healthcare Providers:  SeriousBroker.it  This test is no t yet approved or cleared by the [place] FDA and  has been authorized for detection and/or diagnosis of SARS-CoV-2 by FDA under an Emergency Use Authorization (EUA). This EUA will remain  in effect (meaning this test can be used) for the duration of the COVID-19 declaration under Section 564(b)(1) of the Act, 21 U.S.C.section 360bbb-3(b)(1), unless the authorization is terminated  or revoked sooner.       Influenza A by PCR NEGATIVE NEGATIVE Final   Influenza B by PCR NEGATIVE NEGATIVE Final    Comment: (NOTE) The Xpert Xpress SARS-CoV-2/FLU/RSV plus assay is intended as an aid in the diagnosis of influenza from Nasopharyngeal swab specimens and should not be used as a sole basis for treatment. Nasal washings and aspirates are unacceptable for Xpert Xpress SARS-CoV-2/FLU/RSV testing.  Fact Sheet for Patients: BloggerCourse.com  Fact Sheet for Healthcare Providers: SeriousBroker.it  This test is not yet approved or cleared by the [place] FDA and has been authorized for detection and/or diagnosis of SARS-CoV-2 by FDA under an Emergency Use Authorization (EUA). This EUA will remain in effect (meaning this test can be used) for the duration of the COVID-19 declaration under Section 564(b)(1) of the Act, 21 U.S.C. section 360bbb-3(b)(1), unless the authorization is terminated or revoked.  Performed at [facility], [address] [place], [place] [address]     Labs: CBC: Recent Labs  Lab 07/20/21 2351  WBC 5.6  NEUTROABS 2.2  HGB 12.3  HCT 36.3  MCV 93.1  PLT 198   Basic Metabolic Panel: Recent Labs  Lab 07/20/21 2351 07/21/21 0916  NA 139 140  K 2.3* 3.4*  CL 105 110  CO2 28 26  GLUCOSE 144* 90  BUN 23 21   CREATININE 1.23* 1.12*  CALCIUM 9.5 9.1  MG 2.0  --    Liver Function Tests: No results for input(s): "AST", "ALT", "ALKPHOS", "BILITOT", "PROT", "ALBUMIN" in the last 168 hours. CBG: No results for input(s): "GLUCAP" in the last 168 hours.  Discharge time spent: less than 30 minutes.  Signed: Emeline General, MD [facility] 07/21/2021

## 2021-07-21 NOTE — ED Notes (Signed)
[facility] in to transport pt to [facility]

## 2021-07-21 NOTE — Plan of Care (Signed)
Name: Valerie Kane  Apr 01, 1950 MR# [id number] Dr Nicanor Alcon from [facility] ED  71 years old woman history of hypertension, hyperlipidemia, chronic hypokalemia, not on any diuretics, no diarrhea or vomiting who came to hospital with leg pain, back pain, weakness and cramping.  Found to have severe hypokalemia with potassium down to 2.3.   Creatinine 1.2.   Magnesium 2.0.  Received potassium, magnesium supplements in the ED. EKG showed QT prolongation 480 ms, no visible U wave.  Patient can be admitted to medical telemetry for management of severe hypokalemia.  Given chronicity would benefit from referral to nephrology as outpatient.  Dr Olevia Bowens

## 2021-07-21 NOTE — ED Notes (Signed)
Handoff given to floor nurse and [facility]

## 2021-07-21 NOTE — ED Notes (Signed)
Pt ambulated to restroom and back without assist  Tolerated well

## 2022-09-04 ENCOUNTER — Ambulatory Visit (INDEPENDENT_AMBULATORY_CARE_PROVIDER_SITE_OTHER): Payer: Medicare HMO

## 2022-09-04 ENCOUNTER — Ambulatory Visit
Admission: EM | Admit: 2022-09-04 | Discharge: 2022-09-04 | Disposition: A | Discharge status: Home / Self Care | Payer: Medicare HMO | Attending: Internal Medicine | Admitting: Internal Medicine

## 2022-09-04 DIAGNOSIS — R0789 Other chest pain: Secondary | ICD-10-CM

## 2022-09-04 DIAGNOSIS — S2242XA Multiple fractures of ribs, left side, initial encounter for closed fracture: Secondary | ICD-10-CM

## 2022-09-04 MED ORDER — METHOCARBAMOL 500 MG PO TABS: 500.0000 mg | ORAL_TABLET | Freq: Two times a day (BID) | ORAL | 0 refills | Status: AC

## 2022-09-04 MED ORDER — NAPROXEN 375 MG PO TABS
375.0000 mg | ORAL_TABLET | Freq: Two times a day (BID) | ORAL | 0 refills | Status: AC
Start: 2022-09-04 — End: ?

## 2022-09-04 NOTE — Discharge Instructions (Addendum)
I will call with your x-ray results and update your diagnosis based off of the results. For now use naproxen for pain and inflammation of your chest wall pain, methocarbamol for muscle relaxing properties involving the ribs.

## 2022-09-04 NOTE — ED Provider Notes (Addendum)
[facility]  Note:  This document was prepared using Conservation officer, historic buildings and may include unintentional dictation errors.  MRN: [id number] DOB: [birth year]  Subjective:   Valerie Kane is a 72 y.o. female presenting for 10-day history of persistent left-sided rib pain over the left lower lateral ribs.  Symptoms started after being hugged very tightly by a family member.  No fever, cough, shortness of breath.  However, she does have chest pain over the same side with taking deep breaths.  Patient is a smoker.  Has been using Advil with minimal relief.  No history of kidney disease, heart disease.  No current facility-administered medications for this encounter.  Current Outpatient Medications:    amLODipine (NORVASC) 5 MG tablet, Take 1 tablet (5 mg total) by mouth daily., Disp: 90 tablet, Rfl: 3   amLODipine (NORVASC) 5 MG tablet, Take 5 mg by mouth daily., Disp: , Rfl:    aspirin EC 81 MG tablet, Take 81 mg by mouth in the morning., Disp: , Rfl:    cyclobenzaprine (FLEXERIL) 5 MG tablet, Take 5 mg by mouth 3 (three) times daily as needed for muscle spasms., Disp: , Rfl:    diclofenac Sodium (VOLTAREN) 1 % GEL, Apply 2 grams on to the skin 4 (four) times daily. (Patient not taking: Reported on 07/21/2021), Disp: 100 g, Rfl: 0   ibuprofen (ADVIL) 200 MG tablet, Take 800 mg by mouth every 6 (six) hours as needed for mild pain or headache., Disp: , Rfl:    lidocaine (LIDODERM) 5 %, Place 1 patch onto the skin daily. Remove & Discard patch within 12 hours or as directed by MD (Patient not taking: Reported on 07/21/2021), Disp: 30 patch, Rfl: 0   losartan (COZAAR) 100 MG tablet, Take 1 tablet (100 mg total) by mouth daily., Disp: 90 tablet, Rfl: 3   losartan (COZAAR) 100 MG tablet, Take 100 mg by mouth daily., Disp: , Rfl:    ondansetron (ZOFRAN ODT) 4 MG disintegrating tablet, Take 1 tablet (4 mg total) by mouth every 8 (eight) hours as needed for nausea or  vomiting., Disp: 20 tablet, Rfl: 0   potassium chloride 20 MEQ TBCR, Take 20 mEq by mouth 2 (two) times daily. (Patient not taking: Reported on 07/21/2021), Disp: 30 tablet, Rfl: 0   rosuvastatin (CRESTOR) 20 MG tablet, Take 20 mg by mouth daily., Disp: , Rfl:    No Known Allergies  Past Medical History:  Diagnosis Date   High cholesterol    Hypertension    Hypokalemia      Past Surgical History:  Procedure Laterality Date   PARTIAL HYSTERECTOMY      No family history on file.  Social History   Tobacco Use   Smoking status: Every Day    Current packs/day: 0.25    Types: Cigarettes   Smokeless tobacco: Never  Vaping Use   Vaping status: Never Used  Substance Use Topics   Alcohol use: No   Drug use: No    ROS   Objective:   Vitals: BP 129/85 (BP Location: Right Arm)   Pulse 62   Temp 97.9 F (36.6 C) (Oral)   Resp 18   SpO2 97%   Physical Exam Constitutional:      General: She is not in acute distress.    Appearance: Normal appearance. She is well-developed. She is not ill-appearing, toxic-appearing or diaphoretic.  HENT:     Head: Normocephalic and atraumatic.     Nose:  Nose normal.     Mouth/Throat:     Mouth: Mucous membranes are moist.  Eyes:     General: No scleral icterus.       Right eye: No discharge.        Left eye: No discharge.     Extraocular Movements: Extraocular movements intact.  Cardiovascular:     Rate and Rhythm: Normal rate and regular rhythm.     Heart sounds: Normal heart sounds. No murmur heard.    No friction rub. No gallop.  Pulmonary:     Effort: Pulmonary effort is normal. No respiratory distress.     Breath sounds: No stridor. No wheezing, rhonchi or rales.  Chest:     Chest wall: Tenderness (exquisite over areas outlined) present.    Skin:    General: Skin is warm and dry.  Neurological:     General: No focal deficit present.     Mental Status: She is alert and oriented to person, place, and time.  Psychiatric:         Mood and Affect: Mood normal.        Behavior: Behavior normal.     Assessment and Plan :   PDMP not reviewed this encounter.  1. Left-sided chest wall pain    X-ray over-read was pending at time of discharge, recommended follow up with only abnormal results. Otherwise will not call for negative over-read. Patient was in agreement. Patient did not want to wait longer for her over-read.  Recommended conservative management using NSAID and muscle relaxant with naproxen at a lower dose and methocarbamol for chest wall pain as it is very reproducible on exam.  Low suspicion for ACS.  Will call with overread results to [phone].  Counseled patient on potential for adverse effects with medications prescribed/recommended today, ER and return-to-clinic precautions discussed, patient verbalized understanding.    Wallis Bamberg, PA-C 09/04/22 3086   1. Closed fracture of multiple ribs of left side, initial encounter   2. Left-sided chest wall pain    Maintain treatment plan.  Notified patient through MyChart.   Wallis Bamberg, [place] 09/04/22 1319

## 2022-09-04 NOTE — ED Triage Notes (Signed)
Pt c/o left rib area pain since 8/3 after a hug from a family member-pain is worse when she takes a deep breath-taking advil-NAD-steady gait

## 2023-01-21 ENCOUNTER — Ambulatory Visit
Admission: EM | Admit: 2023-01-21 | Discharge: 2023-01-21 | Disposition: A | Discharge status: Home / Self Care | Payer: Medicare HMO | Attending: Family Medicine | Admitting: Family Medicine

## 2023-01-21 DIAGNOSIS — J209 Acute bronchitis, unspecified: Secondary | ICD-10-CM | POA: Diagnosis not present

## 2023-01-21 LAB — POC COVID19/FLU A&B COMBO
Covid Antigen, POC: NEGATIVE
Influenza A Antigen, POC: NEGATIVE
Influenza B Antigen, POC: NEGATIVE

## 2023-01-21 MED ORDER — ACETAMINOPHEN 325 MG PO TABS: 650.0000 mg | ORAL_TABLET | Freq: Four times a day (QID) | ORAL | 0 refills | Status: AC | PRN

## 2023-01-21 MED ORDER — PREDNISONE 10 MG PO TABS
30.0000 mg | ORAL_TABLET | Freq: Every day | ORAL | 0 refills | Status: AC
Start: 2023-01-21 — End: ?

## 2023-01-21 MED ORDER — BENZONATATE 100 MG PO CAPS: 100.0000 mg | ORAL_CAPSULE | Freq: Three times a day (TID) | ORAL | 0 refills | Status: AC | PRN

## 2023-01-21 NOTE — ED Provider Notes (Signed)
[facility]  Note:  This document was prepared using Conservation officer, historic buildings and may include unintentional dictation errors.  MRN: [id number] DOB: Jan 17, 1951  Subjective:   Valerie Kane is a 72 y.o. female presenting for 3-day history of acute onset runny nose, fatigue, coughing, significant malaise and fatigue.  Patient reports that she had exposure to a family member that had bronchitis.  No history of asthma.  Patient does smoke cigarettes daily.  No overt chest pain, shortness of breath or wheezing.  No current facility-administered medications for this encounter.  Current Outpatient Medications:    amLODipine (NORVASC) 5 MG tablet, Take 1 tablet (5 mg total) by mouth daily., Disp: 90 tablet, Rfl: 3   amLODipine (NORVASC) 5 MG tablet, Take 5 mg by mouth daily., Disp: , Rfl:    aspirin EC 81 MG tablet, Take 81 mg by mouth in the morning., Disp: , Rfl:    cyclobenzaprine (FLEXERIL) 5 MG tablet, Take 5 mg by mouth 3 (three) times daily as needed for muscle spasms., Disp: , Rfl:    diclofenac Sodium (VOLTAREN) 1 % GEL, Apply 2 grams on to the skin 4 (four) times daily. (Patient not taking: Reported on 07/21/2021), Disp: 100 g, Rfl: 0   ibuprofen (ADVIL) 200 MG tablet, Take 800 mg by mouth every 6 (six) hours as needed for mild pain or headache., Disp: , Rfl:    lidocaine (LIDODERM) 5 %, Place 1 patch onto the skin daily. Remove & Discard patch within 12 hours or as directed by MD (Patient not taking: Reported on 07/21/2021), Disp: 30 patch, Rfl: 0   losartan (COZAAR) 100 MG tablet, Take 1 tablet (100 mg total) by mouth daily., Disp: 90 tablet, Rfl: 3   losartan (COZAAR) 100 MG tablet, Take 100 mg by mouth daily., Disp: , Rfl:    methocarbamol (ROBAXIN) 500 MG tablet, Take 1 tablet (500 mg total) by mouth 2 (two) times daily., Disp: 20 tablet, Rfl: 0   naproxen (NAPROSYN) 375 MG tablet, Take 1 tablet (375 mg total) by mouth 2 (two) times daily with a meal.,  Disp: 30 tablet, Rfl: 0   ondansetron (ZOFRAN ODT) 4 MG disintegrating tablet, Take 1 tablet (4 mg total) by mouth every 8 (eight) hours as needed for nausea or vomiting., Disp: 20 tablet, Rfl: 0   potassium chloride 20 MEQ TBCR, Take 20 mEq by mouth 2 (two) times daily. (Patient not taking: Reported on 07/21/2021), Disp: 30 tablet, Rfl: 0   rosuvastatin (CRESTOR) 20 MG tablet, Take 20 mg by mouth daily., Disp: , Rfl:    No Known Allergies  Past Medical History:  Diagnosis Date   High cholesterol    Hypertension    Hypokalemia      Past Surgical History:  Procedure Laterality Date   PARTIAL HYSTERECTOMY      No family history on file.  Social History   Tobacco Use   Smoking status: Every Day    Current packs/day: 0.25    Types: Cigarettes   Smokeless tobacco: Never  Vaping Use   Vaping status: Never Used  Substance Use Topics   Alcohol use: No   Drug use: No    ROS   Objective:   Vitals: BP 122/86 (BP Location: Left Arm)   Pulse 77   Temp 99 F (37.2 C) (Oral)   Resp 16   Ht 5\' 4"  (1.626 m)   Wt 155 lb (70.3 kg)   SpO2 96%   BMI  26.61 kg/m   Physical Exam Constitutional:      General: She is not in acute distress.    Appearance: Normal appearance. She is well-developed and normal weight. She is not ill-appearing, toxic-appearing or diaphoretic.  HENT:     Head: Normocephalic and atraumatic.     Right Ear: Tympanic membrane, ear canal and external ear normal. No drainage or tenderness. No middle ear effusion. There is no impacted cerumen. Tympanic membrane is not erythematous or bulging.     Left Ear: Tympanic membrane, ear canal and external ear normal. No drainage or tenderness.  No middle ear effusion. There is no impacted cerumen. Tympanic membrane is not erythematous or bulging.     Nose: Nose normal. No congestion or rhinorrhea.     Mouth/Throat:     Mouth: Mucous membranes are moist. No oral lesions.     Pharynx: No pharyngeal swelling,  oropharyngeal exudate, posterior oropharyngeal erythema or uvula swelling.     Tonsils: No tonsillar exudate or tonsillar abscesses.  Eyes:     General: No scleral icterus.       Right eye: No discharge.        Left eye: No discharge.     Extraocular Movements: Extraocular movements intact.     Right eye: Normal extraocular motion.     Left eye: Normal extraocular motion.     Conjunctiva/sclera: Conjunctivae normal.  Cardiovascular:     Rate and Rhythm: Normal rate and regular rhythm.     Heart sounds: Normal heart sounds. No murmur heard.    No friction rub. No gallop.  Pulmonary:     Effort: Pulmonary effort is normal. No respiratory distress.     Breath sounds: No stridor. Rhonchi (mild over mid lung fields bilaterally) present. No wheezing or rales.  Chest:     Chest wall: No tenderness.  Musculoskeletal:     Cervical back: Normal range of motion and neck supple.  Lymphadenopathy:     Cervical: No cervical adenopathy.  Skin:    General: Skin is warm and dry.  Neurological:     General: No focal deficit present.     Mental Status: She is alert and oriented to person, place, and time.  Psychiatric:        Mood and Affect: Mood normal.        Behavior: Behavior normal.     Assessment and Plan :   PDMP not reviewed this encounter.  1. Acute bronchitis, unspecified organism    Recommended managing her bronchitis with prednisone, supportive care.  Will defer imaging for now.  Counseled patient on potential for adverse effects with medications prescribed/recommended today, ER and return-to-clinic precautions discussed, patient verbalized understanding.    Wallis Bamberg, [place] 01/21/23 1610

## 2023-01-21 NOTE — ED Triage Notes (Signed)
Patient presents with cough, runny nose and fatigue x day 3. No treatment used.

## 2023-11-14 ENCOUNTER — Other Ambulatory Visit: Payer: Self-pay | Admitting: Nurse Practitioner

## 2023-11-14 DIAGNOSIS — M549 Dorsalgia, unspecified: Secondary | ICD-10-CM

## 2023-11-14 DIAGNOSIS — R222 Localized swelling, mass and lump, trunk: Secondary | ICD-10-CM

## 2023-11-20 ENCOUNTER — Encounter: Payer: Self-pay | Admitting: Nurse Practitioner

## 2023-11-26 ENCOUNTER — Ambulatory Visit
Admission: RE | Admit: 2023-11-26 | Discharge: 2023-11-26 | Disposition: A | Discharge status: Home / Self Care | Source: Ambulatory Visit | Attending: Nurse Practitioner | Admitting: Nurse Practitioner

## 2023-11-26 DIAGNOSIS — M549 Dorsalgia, unspecified: Secondary | ICD-10-CM

## 2023-11-26 DIAGNOSIS — R222 Localized swelling, mass and lump, trunk: Secondary | ICD-10-CM
